# Patient Record
Sex: Female | Born: 1970 | Race: Black or African American | Hispanic: No | Marital: Married | Smoking: Current every day smoker
Health system: Southern US, Community
[De-identification: ages and names within clinical notes are randomized; demographics above are authoritative.]

## PROBLEM LIST (undated history)

## (undated) DIAGNOSIS — K2970 Gastritis, unspecified, without bleeding: Secondary | ICD-10-CM

## (undated) DIAGNOSIS — G43909 Migraine, unspecified, not intractable, without status migrainosus: Secondary | ICD-10-CM

## (undated) DIAGNOSIS — E079 Disorder of thyroid, unspecified: Secondary | ICD-10-CM

## (undated) HISTORY — PX: ABDOMINAL SURGERY: SHX537

## (undated) HISTORY — PX: ABDOMINAL HYSTERECTOMY: SHX81

## (undated) HISTORY — PX: CHOLECYSTECTOMY: SHX55

---

## 1997-11-04 ENCOUNTER — Other Ambulatory Visit: Admission: RE | Admit: 1997-11-04 | Discharge: 1997-11-04 | Payer: Self-pay | Admitting: Obstetrics & Gynecology

## 1998-06-06 ENCOUNTER — Ambulatory Visit (HOSPITAL_COMMUNITY): Admission: RE | Admit: 1998-06-06 | Discharge: 1998-06-06 | Payer: Self-pay | Admitting: Obstetrics & Gynecology

## 1998-06-06 ENCOUNTER — Encounter: Payer: Self-pay | Admitting: Obstetrics & Gynecology

## 1998-07-08 ENCOUNTER — Ambulatory Visit (HOSPITAL_COMMUNITY): Admission: RE | Admit: 1998-07-08 | Discharge: 1998-07-08 | Payer: Self-pay | Admitting: Obstetrics & Gynecology

## 1998-11-29 ENCOUNTER — Other Ambulatory Visit: Admission: RE | Admit: 1998-11-29 | Discharge: 1998-11-29 | Payer: Self-pay | Admitting: Obstetrics & Gynecology

## 2000-02-21 ENCOUNTER — Other Ambulatory Visit: Admission: RE | Admit: 2000-02-21 | Discharge: 2000-02-21 | Payer: Self-pay | Admitting: Obstetrics & Gynecology

## 2001-03-24 ENCOUNTER — Inpatient Hospital Stay (HOSPITAL_COMMUNITY): Admission: AD | Admit: 2001-03-24 | Discharge: 2001-03-27 | Payer: Self-pay | Admitting: Obstetrics & Gynecology

## 2001-03-24 ENCOUNTER — Encounter (INDEPENDENT_AMBULATORY_CARE_PROVIDER_SITE_OTHER): Payer: Self-pay | Admitting: Specialist

## 2001-04-18 ENCOUNTER — Other Ambulatory Visit: Admission: RE | Admit: 2001-04-18 | Discharge: 2001-04-18 | Payer: Self-pay | Admitting: Obstetrics & Gynecology

## 2002-06-01 ENCOUNTER — Other Ambulatory Visit: Admission: RE | Admit: 2002-06-01 | Discharge: 2002-06-01 | Payer: Self-pay | Admitting: Obstetrics & Gynecology

## 2002-12-01 ENCOUNTER — Encounter (INDEPENDENT_AMBULATORY_CARE_PROVIDER_SITE_OTHER): Payer: Self-pay | Admitting: Specialist

## 2002-12-01 ENCOUNTER — Inpatient Hospital Stay (HOSPITAL_COMMUNITY): Admission: RE | Admit: 2002-12-01 | Discharge: 2002-12-03 | Payer: Self-pay | Admitting: Obstetrics & Gynecology

## 2005-03-27 ENCOUNTER — Other Ambulatory Visit: Admission: RE | Admit: 2005-03-27 | Discharge: 2005-03-27 | Payer: Self-pay | Admitting: Obstetrics & Gynecology

## 2005-08-07 ENCOUNTER — Emergency Department (HOSPITAL_COMMUNITY): Admission: EM | Admit: 2005-08-07 | Discharge: 2005-08-07 | Payer: Self-pay | Admitting: Family Medicine

## 2005-12-11 ENCOUNTER — Encounter: Admission: RE | Admit: 2005-12-11 | Discharge: 2005-12-11 | Payer: Self-pay | Admitting: *Deleted

## 2006-01-14 ENCOUNTER — Encounter: Admission: RE | Admit: 2006-01-14 | Discharge: 2006-01-14 | Payer: Self-pay | Admitting: Internal Medicine

## 2006-03-18 ENCOUNTER — Encounter: Admission: RE | Admit: 2006-03-18 | Discharge: 2006-03-18 | Payer: Self-pay | Admitting: Internal Medicine

## 2006-04-19 ENCOUNTER — Emergency Department (HOSPITAL_COMMUNITY): Admission: EM | Admit: 2006-04-19 | Discharge: 2006-04-19 | Payer: Self-pay | Admitting: Emergency Medicine

## 2008-06-02 ENCOUNTER — Emergency Department (HOSPITAL_BASED_OUTPATIENT_CLINIC_OR_DEPARTMENT_OTHER): Admission: EM | Admit: 2008-06-02 | Discharge: 2008-06-02 | Payer: Self-pay | Admitting: Internal Medicine

## 2008-06-02 ENCOUNTER — Ambulatory Visit: Payer: Self-pay | Admitting: Interventional Radiology

## 2010-03-04 ENCOUNTER — Emergency Department (HOSPITAL_BASED_OUTPATIENT_CLINIC_OR_DEPARTMENT_OTHER): Admission: EM | Admit: 2010-03-04 | Discharge: 2010-03-05 | Payer: Self-pay | Admitting: Emergency Medicine

## 2010-07-17 ENCOUNTER — Encounter: Payer: Self-pay | Admitting: Internal Medicine

## 2010-08-15 ENCOUNTER — Emergency Department (HOSPITAL_COMMUNITY)
Admission: EM | Admit: 2010-08-15 | Discharge: 2010-08-15 | Disposition: A | Discharge status: Home / Self Care | Payer: BC Managed Care – PPO | Attending: Emergency Medicine | Admitting: Emergency Medicine

## 2010-08-15 DIAGNOSIS — H53149 Visual discomfort, unspecified: Secondary | ICD-10-CM | POA: Insufficient documentation

## 2010-08-15 DIAGNOSIS — R110 Nausea: Secondary | ICD-10-CM | POA: Insufficient documentation

## 2010-08-15 DIAGNOSIS — G43909 Migraine, unspecified, not intractable, without status migrainosus: Secondary | ICD-10-CM | POA: Insufficient documentation

## 2010-08-15 DIAGNOSIS — J3489 Other specified disorders of nose and nasal sinuses: Secondary | ICD-10-CM | POA: Insufficient documentation

## 2010-11-10 NOTE — H&P (Signed)
NAME:  [name], [name]                          ACCOUNT NO.:  [id number]   MEDICAL RECORD NO.:  [id number]                   PATIENT TYPE:  INP   LOCATION:  NA                                   FACILITY:  [facility]   PHYSICIAN:  Gerrit Friends. Aldona Bar, M.D.                DATE OF BIRTH:  1971-03-21   DATE OF ADMISSION:  12/01/2002  DATE OF DISCHARGE:                                HISTORY & PHYSICAL   HISTORY:  This patient is a 40 year old female admitted for a total  abdominal hysterectomy with a preoperative diagnosis of persistent  dysmenorrhea and menorrhagia.  This patient was first seen by me in June  1998 with lower abdominal discomfort/pelvic pain.  She ultimately underwent  a laparoscopy in July 1998 with findings consistent with gallstones imbedded  in the left ovary and pelvic peritoneum in various locations.  She  ultimately underwent an exploratory laparotomy in December 1998 at which  time these calculi, clinically gallstones, were removed along with a  significant lysis of adhesions.  Apparently she had had her gallbladder  removed laparoscopically at an earlier time and this was felt to be the  etiology of her problem.   She again required a laparoscopy in January 2000.  She had significant  adhesions involving the uterus and the anterior abdominal wall.  She had  significant lysis of adhesions.  She subsequently had a cesarean section in  2002 after spontaneous rupture of membranes and at that time also underwent  a bilateral partial salpingectomy for permanent elective sterilization.  Her  repeat cesarean section was scheduled electively but unfortunately she  underwent ruptured membranes prior to the date of scheduling and underwent  the procedure several days earlier.  Her postoperative course was  uncomplicated.  Unfortunately, again she began having significant  menorrhagia and dysmenorrhea and for a period of time was managed with  Cataflam and oral contraceptives to  control her cycles, but unfortunately  her pressure became elevated necessitating discontinuation of her birth  control pills and treatment of her hypertension with hydrochlorothiazide  which not only resulted in her blood pressure returning to normal but also  resulted in resolution of her headaches that were probably associated with  her hypertension.  She is now scheduled for total abdominal hysterectomy  because of persistent menorrhagia and dysmenorrhea, probably associated with  recurrent adhesions or possibly even adenomyosis.   PAST MEDICAL HISTORY/PREVIOUS PROCEDURES:  1. Gallbladder surgery March 1997.  2. Diagnostic laparoscopy July 1998.  3. Exploratory laparotomy November 1998.  4. Laparoscopy with adhesions January 2000.  5. Repeat cesarean section 2002.   ALLERGIES:  The patient is allergic to PENICILLIN by history.  She is also  sensitive to PERCOCET - causes itching.   CURRENT MEDICATIONS:  Hydrochlorothiazide 25 mg in the morning for control  of her hypertension.   FAMILY HISTORY AND SOCIAL HISTORY:  Noncontributory.   REVIEW  OF SYSTEMS:  Negative with the exception of above.   PHYSICAL EXAMINATION AT THE TIME OF ADMISSION:  GENERAL:  Finds a well-  developed female in no distress.  VITAL SIGNS:  Weight 186, blood pressure 120/70, pulse 80 and regular,  respirations 18 and regular, temperature 98.2  HEENT:  Negative.  Thyroid not enlarged.  CHEST:  Clear.  CARDIOVASCULAR:  Regular rhythm, no murmur.  BREASTS:  Negative.  ABDOMEN:  There is a well-healed Pfannenstiel incision site.  Abdomen is  unremarkable except for some mild deep tenderness in the pelvis.  PELVIC:  External genitalia, BUS normal.  Introitus bidigital.  Vault fairly  well supported.  Uterus upper limits of normal size, relatively immobile.  Adnexal area is negative.  NEUROLOGIC:  Physiologic.  EXTREMITIES:  Negative.   IMPRESSION:  Persistent menorrhagia and dysmenorrhea - chronic pelvic  pain -  possible adenomyosis, possible recurrent adhesions.   PLAN:  Total abdominal hysterectomy.                                               Gerrit Friends. Aldona Bar, M.D.    RMW/MEDQ  D:  11/27/2002  T:  11/27/2002  Job:  [id number]

## 2010-11-10 NOTE — Op Note (Signed)
[name], [name]                          ACCOUNT NO.:  [id number]   MEDICAL RECORD NO.:  [id number]                   PATIENT TYPE:  INP   LOCATION:  9399                                 FACILITY:  [facility]   PHYSICIAN:  Gerrit Friends. Aldona Bar, M.D.                DATE OF BIRTH:  Oct 27, 1970   DATE OF PROCEDURE:  12/01/2002  DATE OF DISCHARGE:                                 OPERATIVE REPORT   PREOPERATIVE DIAGNOSES:  1. Menorrhagia.  2. Dysmenorrhea.  3. Suspected pelvic adhesions.   POSTOPERATIVE DIAGNOSES:  1. Menorrhagia.  2. Dysmenorrhea.  3. Suspected pelvic adhesions.   PROCEDURE:  Total abdominal hysterectomy, lysis of multiple pelvic  adhesions, and removal of hydatid cyst distal portion left fallopian tube.   SURGEON:  Gerrit Friends. Aldona Bar, M.D.   ASSISTANTLuvenia Redden, M.D.   DESCRIPTION OF PROCEDURE:  The patient was taken to the operating room where  after the satisfactory induction of general endotracheal anesthesia, was  prepped and draped in the usual fashion for abdominal surgery having been  placed in the supine position.  A Foley catheter was placed as part of the  prep.   After the patient was adequately draped, the procedure was begun.  Because  the patient had a large keloid from her two previous cesarean sections and  her previous exploratory laparotomy, the keloid was removed.  Further  dissection completed, opening the abdomen essentially through a Pfannenstiel  - dissecting down sharply to the fascia - incising the fascia transversely,  creating subfascial space inferiorly and superiorly identifying and entering  the peritoneum without difficulty.  At this time, the abdomen was inspected.  There were a lot of adhesions encountered between the lower left abdominal  wall and the left anterior portion of the uterus. Theses were sharply lysed.  There were also adhesions involving the remnants of the left fallopian tube  and the descending sigmoid which were  also sharply lysed.  A large hydatid  cyst along with the distal portion of the left fallopian tube were removed  with the aid of a clamp and suture of 0 Vicryl.  At this time, once the  bowel was adequately packed off and retractors were placed, the procedure  was begun.  Long Kelly clamps were placed at the corners of the uterus and  the uterus was elevated.  The left fallopian tube was clamped, opened,  rendered hemostatic and further dissection anteriorly pushed down the  bladder and permitted isolation of the ovarian pedicle, thus allowing the  left ovary to be freed from the uterus.  The left ovary was normal and was  left in situ.  At this time, similar procedure was carried out on the right  round ligament and the right ovary.  Further dissection anteriorly was  carried out, thus pushing the bladder off the lower uterine segment.  The  uterine artery pedicles  were then clamped, cut, and sutured secure with 0  Vicryl suture and additional parametrial pedicles taken in similar fashion.  This was carried down to the level of the vaginal angle.  At this time, the  uterus was removed by making a stab wound below the cervix into the vagina  and using the Satinsky scissors and excising the uterus and cervix.  The  vaginal angles were elevated and two vaginal angles were secured with figure-  of-eight sutures and the cuff was closed with 0 Vicryl in figure-of-eight  fashion.  At this time, profuse irrigation was carried out and hemostasis  was noted to be adequate.  Both ovaries were suspended to the round ligament  stumps, thus freeing them up out of the pelvis.  Again, hemostasis was  sought after and created in the pelvis.  The packs at this time were  removed.  Appendix was visualized and palpated and noted to be normal and  was left in.  Retractor was removed and at this time, closure of the abdomen  was begun in the usual fashion.  The abdominal peritoneum was closed with 0  Vicryl in  running fashion.  Muscles secured with same.  Subfascial space was  rendered hemostatic and fascia was then closed with 0 Vicryl from angle in  the midline bilaterally. Subcutaneous tissue was rendered hemostatic.  Staples were then used to close the skin.  A sterile compressive dressing  was applied.  At this time, the patient was transported to the recovery room  in satisfactory condition having tolerated the procedure well.  The  estimated blood loss was 200 mL.  All counts were correct x2.   SPECIMENS:  Uterus and cervix as well as the hydatid cyst and distal portion  of the left fallopian tube sent as a separate specimen.   At the appropriate points during the procedure, both ureters were palpated  and noted to be normal in their course and again, at the conclusion of the  hysterectomy, the ureters were palpated and noted to be what felt to be  totally intact.   In summary, this patient underwent a total abdominal hysterectomy and lysis  of adhesions for menorrhagia and dysmenorrhea unrelieved by medical  management - complicated by the development of hypertension.  The procedure  went well.  Estimated blood loss was 200 mL.  All counts were correct x2.                                               Gerrit Friends. Aldona Bar, M.D.    RMW/MEDQ  D:  12/01/2002  T:  12/01/2002  Job:  [id number]

## 2010-11-10 NOTE — Discharge Summary (Signed)
[facility]  Patient:    [name], [name] Visit Number: [id number] MRN: [id number]          Service Type: OBS Location: 910A 9108 01 Attending Physician:  Lars Pinks Dictated by:   Devoria Albe Edward Jolly, M.D. Admit Date:  03/24/2001 Discharge Date: 03/27/2001                             Discharge Summary  ADMISSION DIAGNOSES:          1. Intrauterine gestation at 38+6 weeks.                               2. History of prior cesarean section,                                  desire for repeat cesarean section and                                  bilateral tubal ligation.                               3. Spontaneous rupture of membranes.  DISCHARGE DIAGNOSES:          Status post repeat low segment transverse                               cesarean section with bilateral tubal                               ligation.  SIGNIFICANT OPERATIONS        A repeat low segment transverse cesarean section AND PROCEDURES:               with bilateral tubal ligation was performed                               under the direction of Dr. Ilda Mori with                               the assistance of Dr. Marina Gravel at [facility] on March 24, 2001.  PERTINENT HISTORY AND PHYSICAL EXAMINATION:         The patient was a 40 year old, gravida 4, para 1, female with an EDC of April 01, 2001 who was admitted with spontaneous rupture of membranes. The patient had been scheduled for an elective repeat cesarean section with bilateral tubal ligation on March 26, 2001. The patients prenatal course was significant for her history of prior cesarean section and her desire for a repeat cesarean section delivery. Upon admission, spontaneous rupture of membranes was confirmed.  Again, a discussion was held with the patient regarding her options for care, and the patient chose to proceed with a repeat cesarean section and  bilateral tubal  ligation instead of a trial of vaginal delivery.  HOSPITAL COURSE:              The patient was taken to the operating room where a repeat low segment transverse cesarean section and bilateral tubal ligation was performed under the direction of Dr. Ilda Mori and with the assistance of Dr. Marina Gravel. The surgery was without complications, a viable female infant weighing 7 pounds 1 ounce was delivered. The Apgars were 6 at one minute and 9 at five minutes. The vertex was delivered with the assistance of a vacuum. The cord pH was noted to be 7.07.  The patients postoperative course was unremarkable. The patient remained without any fevers. The incision remained clean, dry, and intact. The patients diet was slowly advanced to regular. She was able to ambulate spontaneously and empty her bladder after the Foley catheter was removed. The patients discharge hemoglobin was noted to be 9.4, and she was tolerating this well.  PATHOLOGY REPORT:             The pathology report demonstrated complete transection of the bilateral fallopian tubes with no pathologic abnormalities.  The patient was found to be in good condition and ready for discharge on March 27, 2001. She is discharged to home.  DISCHARGE MEDICATIONS:        1. Darvocet-N 100 one p.o. q.4-6h. p.r.n. pain.                               2. Iron sulfate 325 mg p.o. b.i.d.                               3. Prenatal vitamin one p.o. q.d.  ACTIVITY:                     The patient will have decreased activity at home for six weeks.  DISCHARGE FOLLOWUP:           The patients staples will be removed in the office on March 28, 2001. She will call if she experiences any problems with fever, increased pain, increased bleeding, drainage or erythema around the incision or any other concern. Dictated by:   Devoria Albe Edward Jolly, M.D. Attending Physician:  Lars Pinks DD:  04/29/01 TD:  04/30/01 Job:  [id number]

## 2010-11-10 NOTE — Op Note (Signed)
[facility]  Patient:    [name], [name] Visit Number: [id number] MRN: [id number]          Service Type: OBS Location: 910A 9108 01 Attending Physician:  Lars Pinks Dictated by:   Caralyn Guile Arlyce Dice, M.D. Proc. Date: 03/24/01 Admit Date:  03/24/2001                             Operative Report  PREOPERATIVE DIAGNOSES:       1. Previous cesarean section.                               2. Spontaneous rupture of membranes.                               3. Voluntary sterilization.                               4. The patient requests repeat cesarean section.  POSTOPERATIVE DIAGNOSES:      1. Previous cesarean section.                               2. Spontaneous rupture of membranes.                               3. Voluntary sterilization.                               4. The patient requests repeat cesarean section.  PROCEDURES:                   1. Repeat low transverse cesarean section.                               2. Bilateral partial salpingectomy for                                  sterilization.  SURGEON:                      Richard D. Arlyce Dice, M.D.  ASSISTANT:                    Marina Gravel, M.D.  ANESTHESIA:                   Spinal.  ESTIMATED BLOOD LOSS:         500 cc.  FINDINGS:                     Female infant.  Apgar score 6 and 9.  Weight 7 lb 1 oz.  Arterial cord pH 7.07.  Clear amniotic fluid.  Normal-appearing right tube an ovary.  Left ovary with adhesions to the posterior fundus. Normal-appearing left tube.  INDICATIONS:                  This is a 40 year old gravida 4, para 1 with an estimated date of confinement of April 01, 2001  who presented to triage with spontaneous rupture of membranes.  The patient was scheduled for a repeat cesarean section and bilateral tubal ligation on October 2.  Because of the ruptured membranes, the decision was made to proceed with delivery at this time.  DESCRIPTION OF  PROCEDURE:     The patient was taken to the operating room and a spinal anesthesia was placed.  She was then placed in the left lateral supine position, the abdomen was prepped and draped in sterile fashion and the bladder was catheterized.  A low transverse incision was made through the previous laparotomy scar and carried down to the fascia, which was extended transversely.  The rectus muscle was divided in the midline.  The peritoneum was entered bluntly and extended sharply.  The lower segment was identified, the serosa was incised and the bladder was displaced inferiorly.  A low transverse incision was made in the fundus.  The membranes were ruptured.  The infant was delivered with the aid of a vacuum extractor.  The delivery took several minutes, but was without complication.  The infant was delivered. Apgar scores were 6 and 9.  Arterial cord pH was obtained, and this was 7.07. The placenta was then delivered.  The uterus was bluntly curettaged.  The lower segment was closed with a running interlocking #1 Vicryl suture.  The bladder flap was not closed.  Attention was turned to the fallopian tubes. The left tube was grasped with a Babcock clamp and traced to the fimbriated end.  A knuckle of tube at the isthmic-ampullary junction was then doubly ligated and a portion of the tube was excised.  An identical procedure was then carried out on the contralateral tube.  The peritoneum and rectus muscle were then reopposed in the midline.  The fascia was closed with running 0 Vicryl suture. The subcutaneous tissue was closed with 3-0 Vicryl suture.  The skin was closed with staples.  The patient tolerated the procedure well and left the operating room in good condition. Dictated by:   Caralyn Guile Arlyce Dice, M.D. Attending Physician:  Lars Pinks DD:  03/24/01 TD:  03/24/01 Job: [id number]

## 2010-11-10 NOTE — Discharge Summary (Signed)
   [name], [name]                          ACCOUNT NO.:  [id number]   MEDICAL RECORD NO.:  [id number]                   PATIENT TYPE:  INP   LOCATION:  9327                                 FACILITY:  [facility]   PHYSICIAN:  Gerrit Friends. Aldona Bar, M.D.                DATE OF BIRTH:  19-Jul-1970   DATE OF ADMISSION:  12/01/2002  DATE OF DISCHARGE:  12/03/2002                                 DISCHARGE SUMMARY   j   DISCHARGE DIAGNOSES:  1. Menorrhagia and dysmenorrhea.  2. Fibroid uterus.  3. Paratubal cyst, left.   PROCEDURES:  Total abdominal hysterectomy, lysis of adhesions, removal of  left paratubal cyst.   HOSPITAL COURSE SUMMARY:  This 40 year old female was admitted after a  history of menorrhagia and dysmenorrhea which initially was controlled by  medical management but unfortunately because of elevation of her blood  pressure associated with the onset of headaches, medical management had to  be stopped.   She was taken to the operating room on the day of admission after a normal  preoperative workup at which time she underwent exploratory laparotomy  through a previous incision site.  She underwent lysis of adhesions and  removal of a left paratubal cyst as well as a total abdominal hysterectomy.  Pathologic specimen was totally benign.  Discharge hemoglobin 10.4 with a  white count of 10,400 and a normal platelet count.   On the morning of December 03, 2002, she was ambulating well, tolerating a  regular diet well, having normal bowel and bladder function was afebrile.  Her wound was clean and dry and she was deemed ready for discharge.  Accordingly, she was given all appropriate instructions and understood all  instructions well.  Her staples were to be removed in the office in four  days.  This is because he now has had four procedures through the same  incision.   DISCHARGE MEDICATIONS:  1. Motrin 600 mg every 6 hours as needed for pain.  2. Dilaudid 2 mg every 4 to 6 hours  as needed for severe pain.  3. She will continue her hydrochlorothiazide 25 mg in the morning and she     requested a prescription for Zyban for smoking cessation which was given     to her.  She was appropriately instructed on Zyban.   FOLLOW UP:  Followup in the office will be carried out on June 14th for  staple removal and afterwards as routine.   CONDITION ON DISCHARGE:  Improved.                                               Gerrit Friends. Aldona Bar, M.D.    RMW/MEDQ  D:  12/03/2002  T:  12/03/2002  Job:  [id number]

## 2011-05-31 ENCOUNTER — Encounter: Payer: Self-pay | Admitting: Family Medicine

## 2011-05-31 ENCOUNTER — Emergency Department (HOSPITAL_BASED_OUTPATIENT_CLINIC_OR_DEPARTMENT_OTHER)
Admission: EM | Admit: 2011-05-31 | Discharge: 2011-05-31 | Disposition: A | Discharge status: Home / Self Care | Payer: BC Managed Care – PPO | Attending: Emergency Medicine | Admitting: Emergency Medicine

## 2011-05-31 DIAGNOSIS — F17200 Nicotine dependence, unspecified, uncomplicated: Secondary | ICD-10-CM | POA: Insufficient documentation

## 2011-05-31 DIAGNOSIS — S39012A Strain of muscle, fascia and tendon of lower back, initial encounter: Secondary | ICD-10-CM

## 2011-05-31 DIAGNOSIS — M549 Dorsalgia, unspecified: Secondary | ICD-10-CM | POA: Insufficient documentation

## 2011-05-31 HISTORY — DX: Gastritis, unspecified, without bleeding: K29.70

## 2011-05-31 HISTORY — DX: Migraine, unspecified, not intractable, without status migrainosus: G43.909

## 2011-05-31 MED ORDER — TRAMADOL HCL 50 MG PO TABS: 50.0000 mg | ORAL_TABLET | Freq: Four times a day (QID) | ORAL | Status: AC | PRN

## 2011-05-31 MED ORDER — CYCLOBENZAPRINE HCL 10 MG PO TABS: 10.0000 mg | ORAL_TABLET | Freq: Two times a day (BID) | ORAL | Status: AC | PRN

## 2011-05-31 NOTE — ED Notes (Signed)
Pt c/o right low back pain "spasm-like" since lifting boxes Monday night. Pt denies n/v/d, denies urinary sx.

## 2011-05-31 NOTE — ED Provider Notes (Signed)
History     CSN: [id number] Arrival date & time: 05/31/2011  9:35 AM   First MD Initiated Contact with Patient 05/31/11 1017      Chief Complaint  Patient presents with  . Back Pain    (Consider location/radiation/quality/duration/timing/severity/associated sxs/prior treatment) HPI 40 yo F presents with 3 days of right-sided thoracic and lumbar paraspinal muscle tenderness since injuring herself while moving christmas decorations at home.  Pain is worse with movement and better with rest.  She says it an ache and rates it a 5/10 at rest.  She was sent by her supervisor who thought she might have a pinched nerve.  She denies urinary symptoms, incontinence, neurologic deficits of any sort, and any predisposition to fractures.There are no other modifying or associated factors. Past Medical History  Diagnosis Date  . Migraines   . Gastritis     Past Surgical History  Procedure Date  . Abdominal hysterectomy   . Cholecystectomy   . Abdominal surgery     History reviewed. No pertinent family history.  History  Substance Use Topics  . Smoking status: Current Everyday Smoker  . Smokeless tobacco: Not on file  . Alcohol Use: Yes    OB History    Grav Para Term Preterm Abortions TAB SAB Ect Mult Living                  Review of Systems  Constitutional: Negative.   HENT: Negative.   Eyes: Negative.   Respiratory: Negative.   Cardiovascular: Negative.   Gastrointestinal: Negative.   Genitourinary: Negative.   Musculoskeletal: Positive for back pain.  Skin: Negative.   Neurological: Negative.   Hematological: Negative.   Psychiatric/Behavioral: Negative.   All other systems reviewed and are negative.    Allergies  Codeine; Penicillins; and Percocet  Home Medications   Current Outpatient Rx  Name Route Sig Dispense Refill  . NEXIUM PO Oral Take by mouth.      . TOPIRAMATE PO Oral Take by mouth.      . CYCLOBENZAPRINE HCL 10 MG PO TABS Oral Take 1 tablet (10 mg  total) by mouth 2 (two) times daily as needed for muscle spasms. 20 tablet 0  . TRAMADOL HCL 50 MG PO TABS Oral Take 1 tablet (50 mg total) by mouth every 6 (six) hours as needed for pain. Maximum dose= 8 tablets per day 15 tablet 0    BP 121/82  Pulse 67  Temp(Src) 97.1 F (36.2 C) (Oral)  Resp 16  Ht 5\' 6"  (1.676 m)  Wt 187 lb (84.823 kg)  BMI 30.18 kg/m2  SpO2 100%  Physical Exam  Nursing note and vitals reviewed. Constitutional: She is oriented to person, place, and time. She appears well-developed and well-nourished. No distress.  HENT:  Head: Normocephalic and atraumatic.  Eyes: Conjunctivae and EOM are normal. Pupils are equal, round, and reactive to light.  Neck: Normal range of motion.  Cardiovascular: Normal rate, regular rhythm, normal heart sounds and intact distal pulses.  Exam reveals no gallop and no friction rub.   No murmur heard. Pulmonary/Chest: Effort normal and breath sounds normal. No respiratory distress. She has no wheezes. She has no rales.  Abdominal: Soft. Bowel sounds are normal. She exhibits no distension. There is no tenderness. There is no rebound and no guarding.  Musculoskeletal: Normal range of motion. She exhibits no edema.       TTP over the right thoracic and lumbar musculature  Neurological: She is alert and oriented to  person, place, and time. No cranial nerve deficit. She exhibits normal muscle tone. Coordination normal.  Skin: Skin is warm and dry. No rash noted. No erythema.  Psychiatric: She has a normal mood and affect.    ED Course  Procedures (including critical care time)  Labs Reviewed - No data to display No results found.   1. Back strain       MDM  Patient had back strain with no red flag symptoms requiring further evaluation. She was discharged with flexeril and tramadol for her symptoms.  She was discharged in good condition.        Cyndra Numbers, MD 05/31/11 2049

## 2012-08-27 ENCOUNTER — Other Ambulatory Visit: Payer: Self-pay | Admitting: Internal Medicine

## 2012-08-27 DIAGNOSIS — E049 Nontoxic goiter, unspecified: Secondary | ICD-10-CM

## 2012-09-10 ENCOUNTER — Encounter (HOSPITAL_COMMUNITY)
Admission: RE | Admit: 2012-09-10 | Discharge: 2012-09-10 | Disposition: A | Discharge status: Home / Self Care | Payer: BC Managed Care – PPO | Source: Ambulatory Visit | Attending: Internal Medicine | Admitting: Internal Medicine

## 2012-09-10 DIAGNOSIS — E049 Nontoxic goiter, unspecified: Secondary | ICD-10-CM | POA: Insufficient documentation

## 2012-09-11 ENCOUNTER — Encounter (HOSPITAL_COMMUNITY)
Admission: RE | Admit: 2012-09-11 | Discharge: 2012-09-11 | Disposition: A | Discharge status: Home / Self Care | Payer: BC Managed Care – PPO | Source: Ambulatory Visit | Attending: Internal Medicine | Admitting: Internal Medicine

## 2012-09-11 MED ORDER — SODIUM PERTECHNETATE TC 99M INJECTION
10.8000 | Freq: Once | INTRAVENOUS | Status: AC | PRN
  Administered 2012-09-11: 10.8 via INTRAVENOUS

## 2012-09-11 MED ORDER — SODIUM IODIDE I 131 CAPSULE
14.0000 | Freq: Once | INTRAVENOUS | Status: AC | PRN
  Administered 2012-09-11: 14 via ORAL

## 2012-10-01 ENCOUNTER — Other Ambulatory Visit (HOSPITAL_COMMUNITY): Payer: Self-pay | Admitting: Internal Medicine

## 2012-10-01 DIAGNOSIS — Z1231 Encounter for screening mammogram for malignant neoplasm of breast: Secondary | ICD-10-CM

## 2012-10-02 ENCOUNTER — Ambulatory Visit (HOSPITAL_COMMUNITY): Payer: BC Managed Care – PPO

## 2012-10-02 ENCOUNTER — Ambulatory Visit (HOSPITAL_COMMUNITY)
Admission: RE | Admit: 2012-10-02 | Discharge: 2012-10-02 | Disposition: A | Discharge status: Home / Self Care | Payer: BC Managed Care – PPO | Source: Ambulatory Visit | Attending: Internal Medicine | Admitting: Internal Medicine

## 2012-10-02 DIAGNOSIS — Z1231 Encounter for screening mammogram for malignant neoplasm of breast: Secondary | ICD-10-CM

## 2013-10-28 ENCOUNTER — Emergency Department (HOSPITAL_BASED_OUTPATIENT_CLINIC_OR_DEPARTMENT_OTHER): Payer: BC Managed Care – PPO

## 2013-10-28 ENCOUNTER — Encounter (HOSPITAL_BASED_OUTPATIENT_CLINIC_OR_DEPARTMENT_OTHER): Payer: Self-pay | Admitting: Emergency Medicine

## 2013-10-28 ENCOUNTER — Emergency Department (HOSPITAL_BASED_OUTPATIENT_CLINIC_OR_DEPARTMENT_OTHER)
Admission: EM | Admit: 2013-10-28 | Discharge: 2013-10-28 | Disposition: A | Discharge status: Home / Self Care | Payer: BC Managed Care – PPO | Attending: Emergency Medicine | Admitting: Emergency Medicine

## 2013-10-28 DIAGNOSIS — Z880 Allergy status to penicillin: Secondary | ICD-10-CM | POA: Insufficient documentation

## 2013-10-28 DIAGNOSIS — Z79899 Other long term (current) drug therapy: Secondary | ICD-10-CM | POA: Insufficient documentation

## 2013-10-28 DIAGNOSIS — K5900 Constipation, unspecified: Secondary | ICD-10-CM | POA: Insufficient documentation

## 2013-10-28 DIAGNOSIS — F17200 Nicotine dependence, unspecified, uncomplicated: Secondary | ICD-10-CM | POA: Insufficient documentation

## 2013-10-28 DIAGNOSIS — E079 Disorder of thyroid, unspecified: Secondary | ICD-10-CM | POA: Insufficient documentation

## 2013-10-28 DIAGNOSIS — Z9089 Acquired absence of other organs: Secondary | ICD-10-CM | POA: Insufficient documentation

## 2013-10-28 DIAGNOSIS — Z8679 Personal history of other diseases of the circulatory system: Secondary | ICD-10-CM | POA: Insufficient documentation

## 2013-10-28 DIAGNOSIS — Z90710 Acquired absence of both cervix and uterus: Secondary | ICD-10-CM | POA: Insufficient documentation

## 2013-10-28 HISTORY — DX: Disorder of thyroid, unspecified: E07.9

## 2013-10-28 LAB — URINALYSIS, ROUTINE W REFLEX MICROSCOPIC
BILIRUBIN URINE: NEGATIVE
Glucose, UA: NEGATIVE mg/dL
Hgb urine dipstick: NEGATIVE
Ketones, ur: NEGATIVE mg/dL
Leukocytes, UA: NEGATIVE
NITRITE: NEGATIVE
Protein, ur: NEGATIVE mg/dL
SPECIFIC GRAVITY, URINE: 1.026 (ref 1.005–1.030)
UROBILINOGEN UA: 1 mg/dL (ref 0.0–1.0)
pH: 6.5 (ref 5.0–8.0)

## 2013-10-28 LAB — CBC WITH DIFFERENTIAL/PLATELET
BASOS PCT: 0 % (ref 0–1)
Basophils Absolute: 0 10*3/uL (ref 0.0–0.1)
EOS ABS: 0.1 10*3/uL (ref 0.0–0.7)
EOS PCT: 2 % (ref 0–5)
HCT: 37.1 % (ref 36.0–46.0)
HEMOGLOBIN: 12.5 g/dL (ref 12.0–15.0)
Lymphocytes Relative: 56 % — ABNORMAL HIGH (ref 12–46)
Lymphs Abs: 5 10*3/uL — ABNORMAL HIGH (ref 0.7–4.0)
MCH: 30.3 pg (ref 26.0–34.0)
MCHC: 33.7 g/dL (ref 30.0–36.0)
MCV: 89.8 fL (ref 78.0–100.0)
MONO ABS: 0.6 10*3/uL (ref 0.1–1.0)
MONOS PCT: 6 % (ref 3–12)
Neutro Abs: 3.1 10*3/uL (ref 1.7–7.7)
Neutrophils Relative %: 35 % — ABNORMAL LOW (ref 43–77)
PLATELETS: 305 10*3/uL (ref 150–400)
RBC: 4.13 MIL/uL (ref 3.87–5.11)
RDW: 13.7 % (ref 11.5–15.5)
WBC: 8.8 10*3/uL (ref 4.0–10.5)

## 2013-10-28 LAB — COMPREHENSIVE METABOLIC PANEL
ALBUMIN: 3.6 g/dL (ref 3.5–5.2)
ALT: 12 U/L (ref 0–35)
AST: 14 U/L (ref 0–37)
Alkaline Phosphatase: 115 U/L (ref 39–117)
BUN: 12 mg/dL (ref 6–23)
CALCIUM: 9.1 mg/dL (ref 8.4–10.5)
CHLORIDE: 110 meq/L (ref 96–112)
CO2: 21 mEq/L (ref 19–32)
Creatinine, Ser: 1.1 mg/dL (ref 0.50–1.10)
GFR, EST AFRICAN AMERICAN: 70 mL/min — AB (ref >90)
GFR, EST NON AFRICAN AMERICAN: 60 mL/min — AB (ref >90)
GLUCOSE: 101 mg/dL — AB (ref 70–99)
Potassium: 3.7 mEq/L (ref 3.7–5.3)
SODIUM: 145 meq/L (ref 137–147)
TOTAL PROTEIN: 6.9 g/dL (ref 6.0–8.3)

## 2013-10-28 LAB — LIPASE, BLOOD: LIPASE: 57 U/L (ref 11–59)

## 2013-10-28 MED ORDER — SODIUM CHLORIDE 0.9 % IV SOLN
1000.0000 mL | INTRAVENOUS | Status: DC
  Administered 2013-10-28: 1000 mL via INTRAVENOUS

## 2013-10-28 MED ORDER — POLYETHYLENE GLYCOL 3350 17 G PO PACK: 17.0000 g | PACK | Freq: Every day | ORAL | Status: AC

## 2013-10-28 MED ORDER — DOCUSATE SODIUM 100 MG PO CAPS: 100.0000 mg | ORAL_CAPSULE | Freq: Two times a day (BID) | ORAL | Status: AC

## 2013-10-28 MED ORDER — SODIUM CHLORIDE 0.9 % IV SOLN
1000.0000 mL | Freq: Once | INTRAVENOUS | Status: AC
  Administered 2013-10-28: 1000 mL via INTRAVENOUS

## 2013-10-28 MED ORDER — IBUPROFEN 800 MG PO TABS: 400.0000 mg | ORAL_TABLET | Freq: Three times a day (TID) | ORAL | Status: AC

## 2013-10-28 MED ORDER — KETOROLAC TROMETHAMINE 30 MG/ML IJ SOLN
INTRAMUSCULAR | Status: AC
  Administered 2013-10-28: 30 mg
  Filled 2013-10-28: qty 1

## 2013-10-28 NOTE — Discharge Instructions (Signed)
Constipation, Adult  Constipation is when a person:  · Poops (bowel movement) less than 3 times a week.  · Has a hard time pooping.  · Has poop that is dry, hard, or bigger than normal.  HOME CARE   · Eat more fiber, such as fruits, vegetables, whole grains like brown rice, and beans.  · Eat less fatty foods and sugar. This includes French fries, hamburgers, cookies, candy, and soda.  · If you are not getting enough fiber from food, take products with added fiber in them (supplements).  · Drink enough fluid to keep your pee (urine) clear or pale yellow.  · Go to the restroom when you feel like you need to poop. Do not hold it.  · Only take medicine as told by your doctor. Do not take medicines that help you poop (laxatives) without talking to your doctor first.  · Exercise on a regular basis, or as told by your doctor.  GET HELP RIGHT AWAY IF:   · You have bright red blood in your poop (stool).  · Your constipation lasts more than 4 days or gets worse.  · You have belly (abdomen) or butt (rectal) pain.  · You have thin poop (as thin as a pencil).  · You lose weight, and it cannot be explained.  MAKE SURE YOU:   · Understand these instructions.  · Will watch your condition.  · Will get help right away if you are not doing well or get worse.  Document Released: 11/28/2007 Document Revised: 09/03/2011 Document Reviewed: 03/23/2013  [employer]® Patient Information ©2014 [employer].

## 2013-10-28 NOTE — ED Notes (Signed)
Pt reports has had constipation and indigestion, pain in LUQ with radiation to flank.  Worse after eating.  Has had nausea no vomiting.

## 2013-10-28 NOTE — ED Provider Notes (Signed)
CSN: [id number]     Arrival date & time 10/28/13  1906 History  This chart was scribed for Celene KrasJon R Peace Noyes, MD by Beverly MilchJ Harrison Collins, ED Scribe. This patient was seen in room MH11/MH11 and the patient's care was started at 7:26 PM.    Chief Complaint  Patient presents with  . Back Pain    The history is provided by the patient. No language interpreter was used.    HPI Comments: [name] is a 43 y.o. female who presents to the Emergency Department complaining of left flank pain radiating into her left back that began 6 days ago. Pt states she saw a provider at the [facility] in [place] who told her he thought it was a pinched nerve. She reports pain with bending over and movement. She states her pain worsens after eating, and she notices a "pulsating" after eating. She reports two episodes of diarrhea 3 days ago. Pt denies vomiting and nausea. She states she has taken ibuprofen with mild improvement of her pain. Pt declines "strong" pain medication.   Past Medical History  Diagnosis Date  . Migraines   . Gastritis   . Thyroid disease     Past Surgical History  Procedure Laterality Date  . Abdominal hysterectomy    . Cholecystectomy    . Abdominal surgery      No family history on file. History  Substance Use Topics  . Smoking status: Current Every Day Smoker  . Smokeless tobacco: Not on file  . Alcohol Use: Yes     Comment: occ    Review of Systems  Genitourinary: Positive for flank pain.  Musculoskeletal: Positive for back pain.  A complete 10 system review of systems was obtained and all systems are negative except as noted in the HPI and PMH.    Allergies  Codeine; Penicillins; and Percocet   Home Medications    Prior to Admission medications   Medication Sig Start Date End Date Taking? Authorizing Provider  baclofen (LIORESAL) 10 MG tablet Take 10 mg by mouth 3 (three) times daily.   Yes Historical Provider, MD  docusate calcium (SURFAK) 240 MG capsule Take  240 mg by mouth daily.   Yes Historical Provider, MD  hydrOXYzine (ATARAX/VISTARIL) 10 MG tablet Take 10 mg by mouth 3 (three) times daily as needed.   Yes Historical Provider, MD  ibuprofen (ADVIL,MOTRIN) 400 MG tablet Take 400 mg by mouth every 6 (six) hours as needed.   Yes Historical Provider, MD  methimazole (TAPAZOLE) 10 MG tablet Take 10 mg by mouth 3 (three) times daily.   Yes Historical Provider, MD  omeprazole (PRILOSEC) 20 MG capsule Take 20 mg by mouth daily.   Yes Historical Provider, MD  Esomeprazole Magnesium (NEXIUM PO) Take by mouth.      Historical Provider, MD  TOPIRAMATE PO Take 25 mg by mouth.     Historical Provider, MD    Triage Vitals: BP 145/96  Pulse 63  Temp(Src) 99 F (37.2 C) (Oral)  Resp 16  Ht 5\' 6"  (1.676 m)  Wt 191 lb (86.637 kg)  BMI 30.84 kg/m2  SpO2 100%   Physical Exam  Nursing note and vitals reviewed. Constitutional: She appears well-developed and well-nourished. No distress.  HENT:  Head: Normocephalic and atraumatic.  Right Ear: External ear normal.  Left Ear: External ear normal.  Eyes: Conjunctivae are normal. Right eye exhibits no discharge. Left eye exhibits no discharge. No scleral icterus.  Neck: Neck supple. No tracheal deviation  present.  Cardiovascular: Normal rate, regular rhythm and intact distal pulses.   Pulmonary/Chest: Effort normal and breath sounds normal. No stridor. No respiratory distress. She has no wheezes. She has no rales.  Abdominal: Soft. Bowel sounds are normal. She exhibits no distension. There is no tenderness. There is no rebound and no guarding.  Musculoskeletal: She exhibits no edema.       Lumbar back: She exhibits decreased range of motion and tenderness. She exhibits no swelling, no edema and no deformity.  Left paraspinal lumbar region   Neurological: She is alert. She has normal strength. No cranial nerve deficit (no facial droop, extraocular movements intact, no slurred speech) or sensory deficit. She  exhibits normal muscle tone. She displays no seizure activity. Coordination normal.  Skin: Skin is warm and dry. No rash noted.  Psychiatric: She has a normal mood and affect.     ED Course  Procedures (including critical care time)   DIAGNOSTIC STUDIES: Oxygen Saturation is 100% on RA, normal by my interpretation.     COORDINATION OF CARE: 7:31 PM- Pt advised of plan for treatment and pt agrees.   Labs Review Labs Reviewed  CBC WITH DIFFERENTIAL - Abnormal; Notable for the following:    Neutrophils Relative % 35 (*)    Lymphocytes Relative 56 (*)    Lymphs Abs 5.0 (*)    All other components within normal limits  COMPREHENSIVE METABOLIC PANEL - Abnormal; Notable for the following:    Glucose, Bld 101 (*)    Total Bilirubin <0.2 (*)    GFR calc non Af Amer 60 (*)    GFR calc Af Amer 70 (*)    All other components within normal limits  LIPASE, BLOOD  URINALYSIS, ROUTINE W REFLEX MICROSCOPIC    Imaging Review Dg Abd Acute W/chest  10/28/2013   CLINICAL DATA:  Left lower quadrant pain, constipation  EXAM: ACUTE ABDOMEN SERIES (ABDOMEN 2 VIEW & CHEST 1 VIEW)  COMPARISON:  None.  FINDINGS: The heart size and vascular pattern are normal. There is mild left lower lobe atelectatic change and there is mild scarring or atelectatic change in the right lower lobe.  There is no free air. Right upper quadrant clips indicate prior cholecystectomy. Large amount of stool retained throughout the entire colon. There are no abnormally dilated loops of bowel or abnormal air-fluid levels.  IMPRESSION: Significant fecal retention consistent with constipation.   Electronically Signed   By: Esperanza Heiraymond  Rubner M.D.   On: 10/28/2013 20:42      MDM   Final diagnoses:  Constipation   Constipation noted on xray.  Could be associated with her flank pain.   No sign of obstruction.  Labs normal.  Will dc home with stool softeners.   I personally performed the services described in this documentation,  which was scribed in my presence.  The recorded information has been reviewed and is accurate.   Celene KrasJon R Linde Wilensky, MD 10/28/13 2103

## 2014-02-28 ENCOUNTER — Encounter (HOSPITAL_BASED_OUTPATIENT_CLINIC_OR_DEPARTMENT_OTHER): Payer: Self-pay | Admitting: Emergency Medicine

## 2014-02-28 ENCOUNTER — Emergency Department (HOSPITAL_BASED_OUTPATIENT_CLINIC_OR_DEPARTMENT_OTHER)
Admission: EM | Admit: 2014-02-28 | Discharge: 2014-02-28 | Disposition: A | Discharge status: Home / Self Care | Payer: BC Managed Care – PPO | Attending: Emergency Medicine | Admitting: Emergency Medicine

## 2014-02-28 ENCOUNTER — Emergency Department (HOSPITAL_BASED_OUTPATIENT_CLINIC_OR_DEPARTMENT_OTHER): Payer: BC Managed Care – PPO

## 2014-02-28 DIAGNOSIS — Z8639 Personal history of other endocrine, nutritional and metabolic disease: Secondary | ICD-10-CM | POA: Insufficient documentation

## 2014-02-28 DIAGNOSIS — Y9289 Other specified places as the place of occurrence of the external cause: Secondary | ICD-10-CM | POA: Diagnosis not present

## 2014-02-28 DIAGNOSIS — S99929A Unspecified injury of unspecified foot, initial encounter: Secondary | ICD-10-CM | POA: Diagnosis present

## 2014-02-28 DIAGNOSIS — G43909 Migraine, unspecified, not intractable, without status migrainosus: Secondary | ICD-10-CM | POA: Insufficient documentation

## 2014-02-28 DIAGNOSIS — F17200 Nicotine dependence, unspecified, uncomplicated: Secondary | ICD-10-CM | POA: Diagnosis not present

## 2014-02-28 DIAGNOSIS — Z880 Allergy status to penicillin: Secondary | ICD-10-CM | POA: Insufficient documentation

## 2014-02-28 DIAGNOSIS — Y939 Activity, unspecified: Secondary | ICD-10-CM | POA: Diagnosis not present

## 2014-02-28 DIAGNOSIS — S93409A Sprain of unspecified ligament of unspecified ankle, initial encounter: Secondary | ICD-10-CM | POA: Diagnosis not present

## 2014-02-28 DIAGNOSIS — S8990XA Unspecified injury of unspecified lower leg, initial encounter: Secondary | ICD-10-CM | POA: Diagnosis present

## 2014-02-28 DIAGNOSIS — M129 Arthropathy, unspecified: Secondary | ICD-10-CM | POA: Diagnosis not present

## 2014-02-28 DIAGNOSIS — W010XXA Fall on same level from slipping, tripping and stumbling without subsequent striking against object, initial encounter: Secondary | ICD-10-CM | POA: Insufficient documentation

## 2014-02-28 DIAGNOSIS — Z791 Long term (current) use of non-steroidal anti-inflammatories (NSAID): Secondary | ICD-10-CM | POA: Diagnosis not present

## 2014-02-28 DIAGNOSIS — Z862 Personal history of diseases of the blood and blood-forming organs and certain disorders involving the immune mechanism: Secondary | ICD-10-CM | POA: Insufficient documentation

## 2014-02-28 DIAGNOSIS — S99919A Unspecified injury of unspecified ankle, initial encounter: Secondary | ICD-10-CM

## 2014-02-28 DIAGNOSIS — Z79899 Other long term (current) drug therapy: Secondary | ICD-10-CM | POA: Diagnosis not present

## 2014-02-28 DIAGNOSIS — S93401A Sprain of unspecified ligament of right ankle, initial encounter: Secondary | ICD-10-CM

## 2014-02-28 MED ORDER — METHOCARBAMOL 500 MG PO TABS: 1000.0000 mg | ORAL_TABLET | Freq: Four times a day (QID) | ORAL | Status: AC | PRN

## 2014-02-28 MED ORDER — METHOCARBAMOL 500 MG PO TABS
1000.0000 mg | ORAL_TABLET | Freq: Once | ORAL | Status: AC
  Administered 2014-02-28: 1000 mg via ORAL
  Filled 2014-02-28: qty 2

## 2014-02-28 MED ORDER — IBUPROFEN 800 MG PO TABS
800.0000 mg | ORAL_TABLET | Freq: Once | ORAL | Status: AC
  Administered 2014-02-28: 800 mg via ORAL
  Filled 2014-02-28: qty 1

## 2014-02-28 NOTE — Discharge Instructions (Signed)
Rest, Ice intermittently (in the first 24-48 hours), Gentle compression with an Ace wrap, and elevate (Limb above the level of the heart)   Take up to  of ibuprofen (that is usually 4 over the counter pills)  3 times a day for 5 days. Take with food.  For breakthrough pain you may take Robaxin. Do not drink alcohol, drive or operate heavy machinery when taking Robaxin.   Ankle Sprain An ankle sprain is an injury to the strong, fibrous tissues (ligaments) that hold the bones of your ankle joint together.  CAUSES An ankle sprain is usually caused by a fall or by twisting your ankle. Ankle sprains most commonly occur when you step on the outer edge of your foot, and your ankle turns inward. People who participate in sports are more prone to these types of injuries.  SYMPTOMS   Pain in your ankle. The pain may be present at rest or only when you are trying to stand or walk.  Swelling.  Bruising. Bruising may develop immediately or within 1 to 2 days after your injury.  Difficulty standing or walking, particularly when turning corners or changing directions. DIAGNOSIS  Your caregiver will ask you details about your injury and perform a physical exam of your ankle to determine if you have an ankle sprain. During the physical exam, your caregiver will press on and apply pressure to specific areas of your foot and ankle. Your caregiver will try to move your ankle in certain ways. An X-ray exam may be done to be sure a bone was not broken or a ligament did not separate from one of the bones in your ankle (avulsion fracture).  TREATMENT  Certain types of braces can help stabilize your ankle. Your caregiver can make a recommendation for this. Your caregiver may recommend the use of medicine for pain. If your sprain is severe, your caregiver may refer you to a surgeon who helps to restore function to parts of your skeletal system (orthopedist) or a physical therapist. HOME CARE INSTRUCTIONS    Apply ice to your injury for 1-2 days or as directed by your caregiver. Applying ice helps to reduce inflammation and pain.  Put ice in a plastic bag.  Place a towel between your skin and the bag.  Leave the ice on for 15-20 minutes at a time, every 2 hours while you are awake.  Only take over-the-counter or prescription medicines for pain, discomfort, or fever as directed by your caregiver.  Elevate your injured ankle above the level of your heart as much as possible for 2-3 days.  If your caregiver recommends crutches, use them as instructed. Gradually put weight on the affected ankle. Continue to use crutches or a cane until you can walk without feeling pain in your ankle.  If you have a plaster splint, wear the splint as directed by your caregiver. Do not rest it on anything harder than a pillow for the first 24 hours. Do not put weight on it. Do not get it wet. You may take it off to take a shower or bath.  You may have been given an elastic bandage to wear around your ankle to provide support. If the elastic bandage is too tight (you have numbness or tingling in your foot or your foot becomes cold and blue), adjust the bandage to make it comfortable.  If you have an air splint, you may blow more air into it or let air out to make it more comfortable. You may  take your splint off at night and before taking a shower or bath. Wiggle your toes in the splint several times per day to decrease swelling. SEEK MEDICAL CARE IF:   You have rapidly increasing bruising or swelling.  Your toes feel extremely cold or you lose feeling in your foot.  Your pain is not relieved with medicine. SEEK IMMEDIATE MEDICAL CARE IF:  Your toes are numb or blue.  You have severe pain that is increasing. MAKE SURE YOU:   Understand these instructions.  Will watch your condition.  Will get help right away if you are not doing well or get worse. Document Released: 06/11/2005 Document Revised:  03/05/2012 Document Reviewed: 06/23/2011 [facility] Patient Information 2015 [place], [place]. This information is not intended to replace advice given to you by your health care provider. Make sure you discuss any questions you have with your health care provider.

## 2014-02-28 NOTE — ED Provider Notes (Signed)
Medical screening examination/treatment/procedure(s) were performed by non-physician practitioner and as supervising physician I was immediately available for consultation/collaboration.  Toy Cookey, MD 02/28/14 [phone]

## 2014-02-28 NOTE — ED Notes (Signed)
Pt reports fall in parknig lot. Heard a pop in left foot.

## 2014-02-28 NOTE — ED Provider Notes (Signed)
CSN: [id number]     Arrival date & time 02/28/14  1312 History   First MD Initiated Contact with Patient 02/28/14 1519     Chief Complaint  Patient presents with  . Foot Pain     (Consider location/radiation/quality/duration/timing/severity/associated sxs/prior Treatment) HPI   [name] is a 43 y.o. female complaining of pain to right foot after slip and fall in a parking lot last night. Patient states that she tripped and heard a pop in the foot. She was nonambulatory yesterday but the pain has improved since then she is able to walk but with pain. She's been taking Motrin at home with good relief. Patient denies head trauma, cervicalgia, loss of consciousness, chest pain, palpitations or any syncope associated with a fall.  Past Medical History  Diagnosis Date  . Migraines   . Gastritis   . Thyroid disease    Past Surgical History  Procedure Laterality Date  . Abdominal hysterectomy    . Cholecystectomy    . Abdominal surgery     No family history on file. History  Substance Use Topics  . Smoking status: Current Every Day Smoker  . Smokeless tobacco: Not on file  . Alcohol Use: Yes     Comment: occ   OB History   Grav Para Term Preterm Abortions TAB SAB Ect Mult Living                 Review of Systems  10 systems reviewed and found to be negative, except as noted in the HPI.   Allergies  Codeine; Penicillins; and Percocet  Home Medications   Prior to Admission medications   Medication Sig Start Date End Date Taking? Authorizing Provider  baclofen (LIORESAL) 10 MG tablet Take 10 mg by mouth 3 (three) times daily.    Historical Provider, MD  docusate calcium (SURFAK) 240 MG capsule Take 240 mg by mouth daily.    Historical Provider, MD  docusate sodium (COLACE) 100 MG capsule Take 1 capsule (100 mg total) by mouth every 12 (twelve) hours. 10/28/13   Linwood Dibbles, MD  Esomeprazole Magnesium (NEXIUM PO) Take by mouth.      Historical Provider, MD  hydrOXYzine  (ATARAX/VISTARIL) 10 MG tablet Take 10 mg by mouth 3 (three) times daily as needed.    Historical Provider, MD  ibuprofen (ADVIL,MOTRIN) 800 MG tablet Take 0.5 tablets (400 mg total) by mouth 3 (three) times daily. 10/28/13   Linwood Dibbles, MD  methimazole (TAPAZOLE) 10 MG tablet Take 10 mg by mouth 3 (three) times daily.    Historical Provider, MD  methocarbamol (ROBAXIN) 500 MG tablet Take 2 tablets (1,000 mg total) by mouth 4 (four) times daily as needed (Pain). 02/28/14   Kimiye Strathman, PA-C  omeprazole (PRILOSEC) 20 MG capsule Take 20 mg by mouth daily.    Historical Provider, MD  polyethylene glycol (MIRALAX / GLYCOLAX) packet Take 17 g by mouth daily. 10/28/13   Linwood Dibbles, MD  TOPIRAMATE PO Take 25 mg by mouth.     Historical Provider, MD   BP 138/77  Pulse 71  Temp(Src) 98.1 F (36.7 C) (Oral)  Resp 18  Ht  (1.702 m)  Wt 190 lb (86.183 kg)  BMI 29.75 kg/m2  SpO2 99% Physical Exam  Nursing note and vitals reviewed. Constitutional: She is oriented to person, place, and time. She appears well-developed and well-nourished. No distress.  HENT:  Head: Normocephalic.  Eyes: Conjunctivae and EOM are normal.  Cardiovascular: Normal rate.  Pulmonary/Chest: Effort normal. No stridor.  Musculoskeletal: Normal range of motion.       Feet:  Neurological: She is alert and oriented to person, place, and time.  Psychiatric: She has a normal mood and affect.    ED Course  Procedures (including critical care time) Labs Review Labs Reviewed - No data to display  Imaging Review Dg Foot Complete Right  02/28/2014   CLINICAL DATA:  Stepped on loose gravel twisting right foot. Now with pain involving the top and lateral aspects of the right foot.  EXAM: RIGHT FOOT COMPLETE - 3+ VIEW  COMPARISON:  None.  FINDINGS: There is an old / healed fracture involving the distal diaphysis of the fourth metacarpal. No definite acute displaced fracture. Joint spaces are preserved. No dislocation. Regional  soft tissues appear normal.  IMPRESSION: 1. No acute findings. 2. Old/healed fracture involving the distal diaphysis of the fourth metacarpal.   Electronically Signed   By: Simonne Come M.D.   On: 02/28/2014 14:00     EKG Interpretation None      MDM   Final diagnoses:  Right ankle sprain, initial encounter    Filed Vitals:   02/28/14 1328  BP: 138/77  Pulse: 71  Temp: 98.1 F (36.7 C)  TempSrc: Oral  Resp: 18  Height:  (1.702 m)  Weight: 190 lb (86.183 kg)  SpO2: 99%    Medications  ibuprofen (ADVIL,MOTRIN) tablet 800 mg (800 mg Oral Given 02/28/14 1545)  methocarbamol (ROBAXIN) tablet 1,000 mg (1,000 mg Oral Given 02/28/14 1545)    [name] is a 43 y.o. female presenting with pain to right foot after slip and fall last evening. X-ray with no bony abnormalities. Patient will be given an Ace wrap, crutches and advised rest, ice, compression elevation. Sports medicine followup provided.  Evaluation does not show pathology that would require ongoing emergent intervention or inpatient treatment. Pt is hemodynamically stable and mentating appropriately. Discussed findings and plan with patient/guardian, who agrees with care plan. All questions answered. Return precautions discussed and outpatient follow up given.   New Prescriptions   METHOCARBAMOL (ROBAXIN) 500 MG TABLET    Take 2 tablets (1,000 mg total) by mouth 4 (four) times daily as needed (Pain).         Wynetta Emery, PA-C 02/28/14 1555

## 2014-11-18 IMAGING — CR DG ABDOMEN ACUTE W/ 1V CHEST
3 series · 3 of 3 positions shown · non-contrast
Comparison: None.

CLINICAL DATA: Left lower quadrant pain, constipation

EXAM:
ACUTE ABDOMEN SERIES (ABDOMEN 2 VIEW & CHEST 1 VIEW)

[w chest pa]
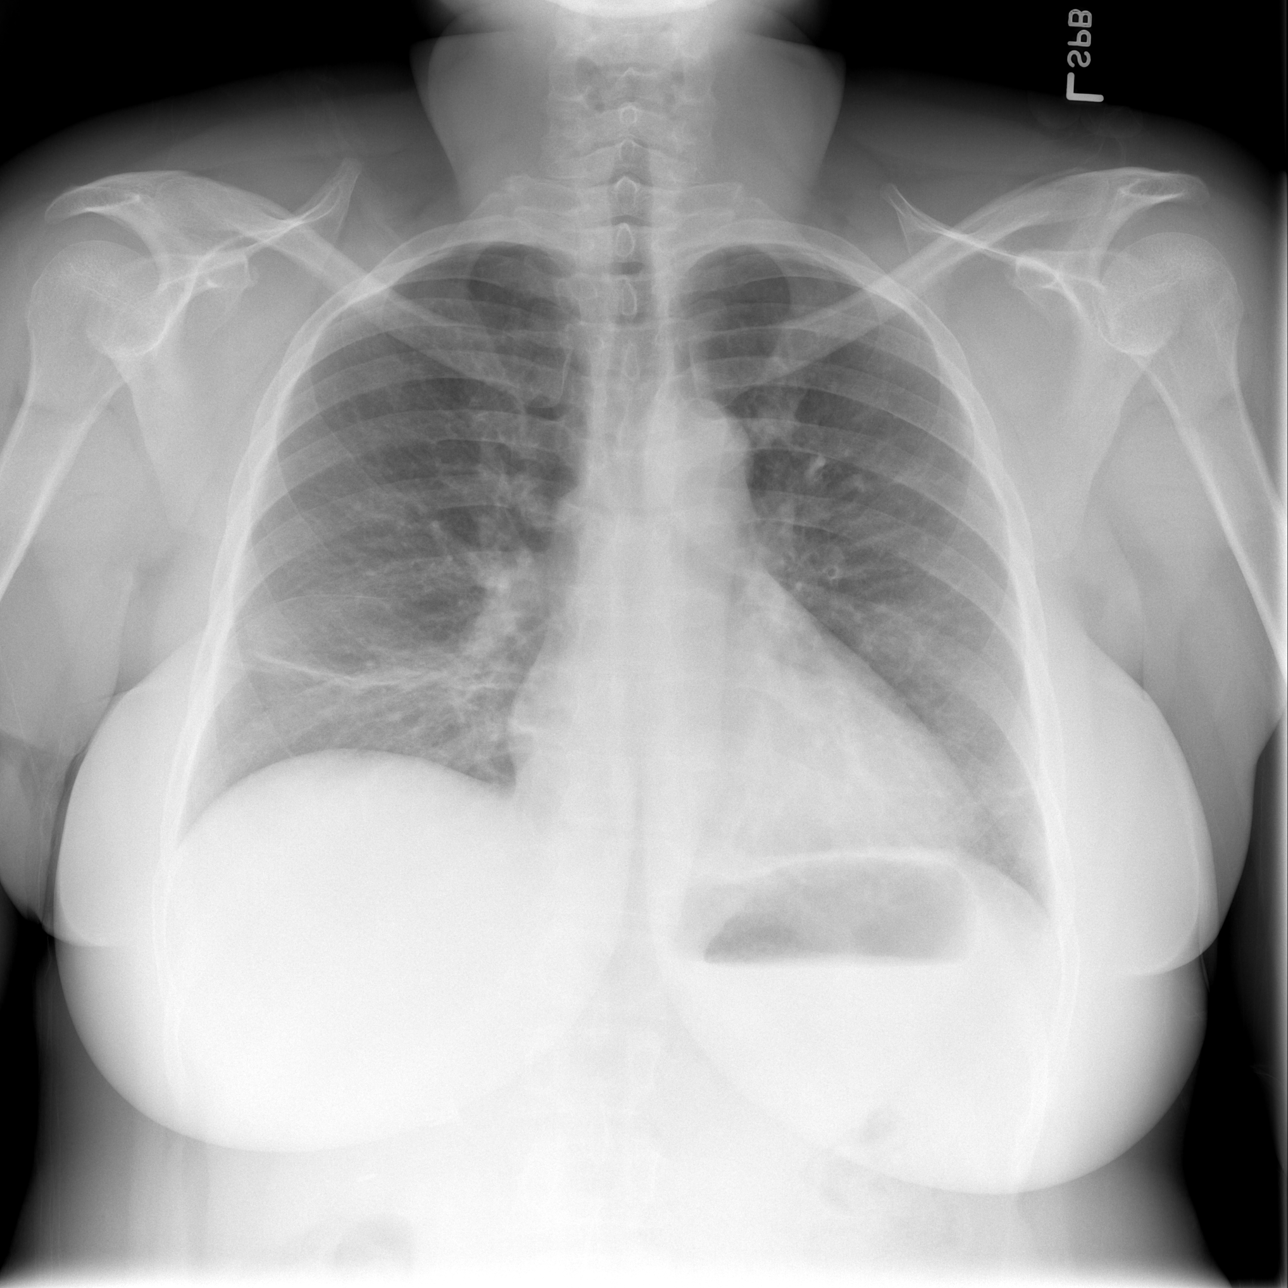

[w abdomen upright]
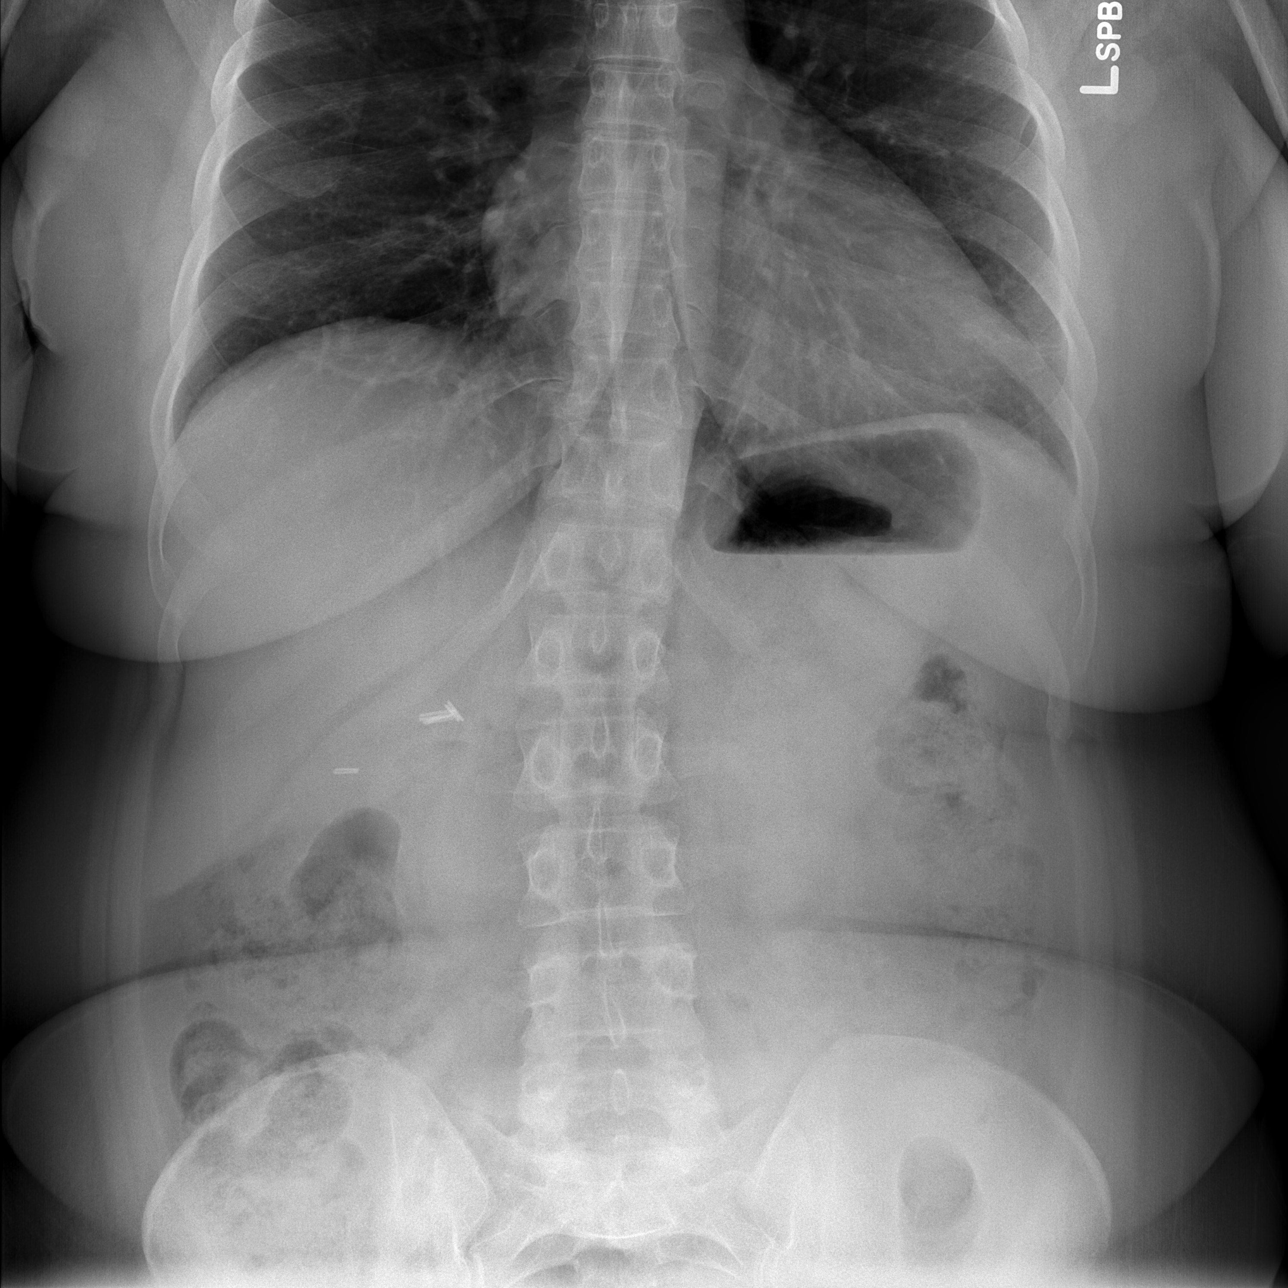

[t abdomen supine]
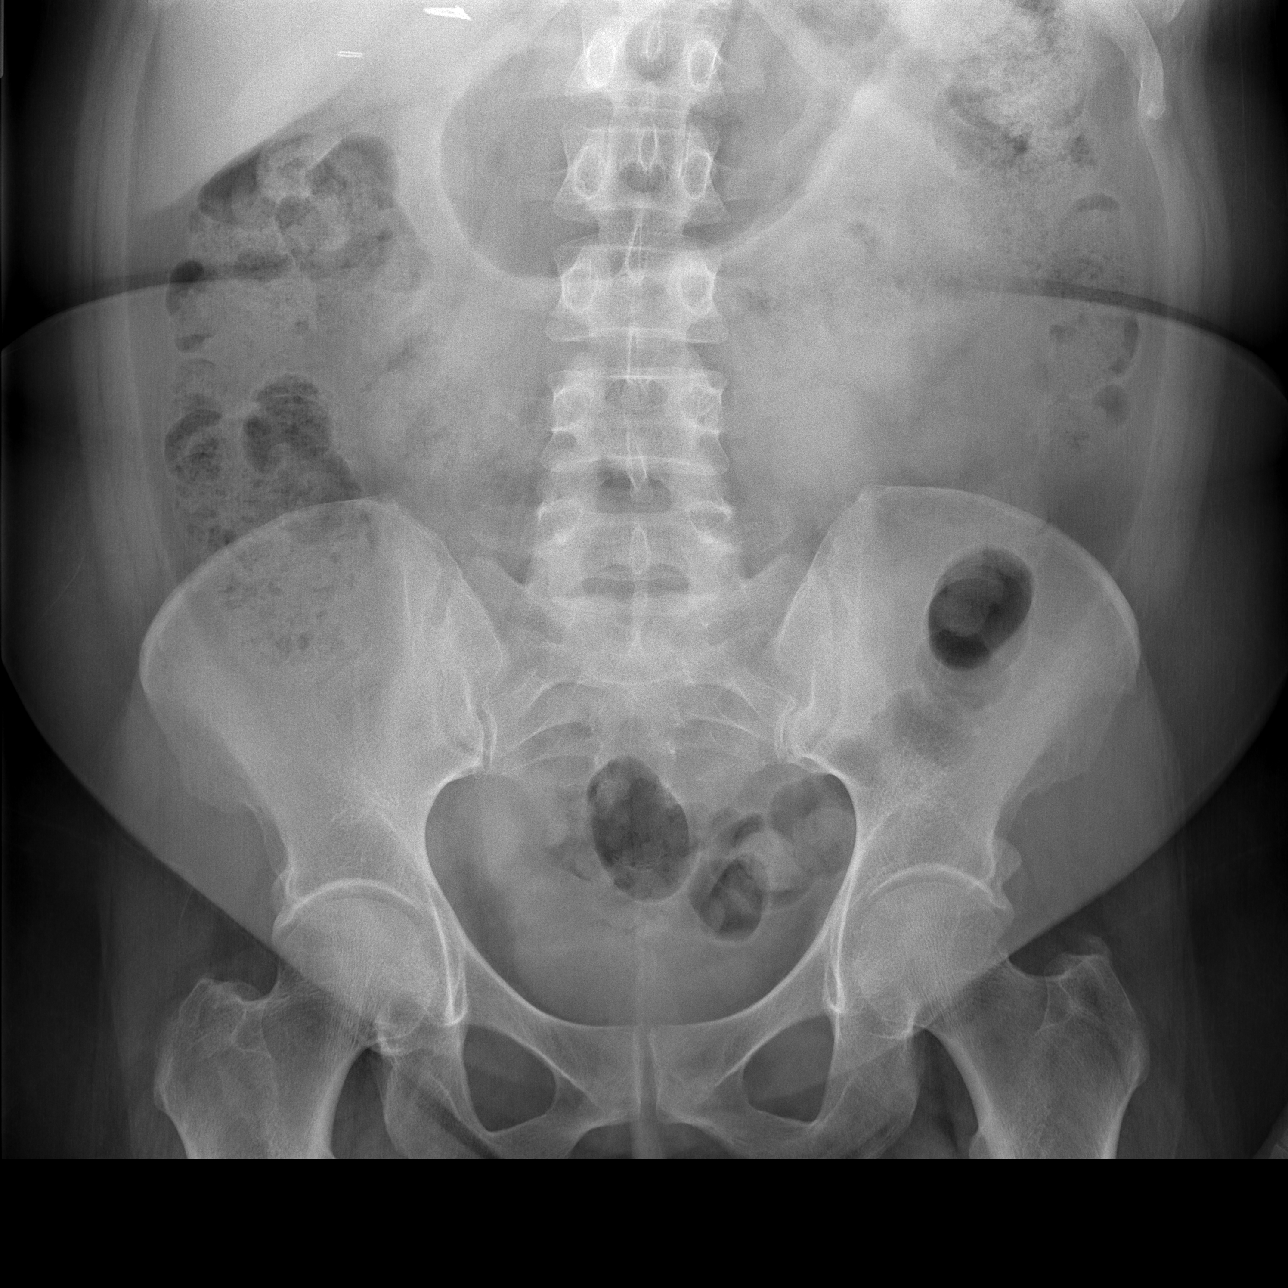

[3 of 3 positions shown; findings below may reference images not displayed]

FINDINGS: The heart size and vascular pattern are normal. There is mild left
lower lobe atelectatic change and there is mild scarring or
atelectatic change in the right lower lobe.

There is no free air. Right upper quadrant clips indicate prior
cholecystectomy. Large amount of stool retained throughout the
entire colon. There are no abnormally dilated loops of bowel or
abnormal air-fluid levels.
IMPRESSION: Significant fecal retention consistent with constipation.

## 2015-09-18 ENCOUNTER — Emergency Department (HOSPITAL_BASED_OUTPATIENT_CLINIC_OR_DEPARTMENT_OTHER)
Admission: EM | Admit: 2015-09-18 | Discharge: 2015-09-18 | Disposition: A | Discharge status: Home / Self Care | Payer: BC Managed Care – PPO | Attending: Emergency Medicine | Admitting: Emergency Medicine

## 2015-09-18 ENCOUNTER — Encounter (HOSPITAL_BASED_OUTPATIENT_CLINIC_OR_DEPARTMENT_OTHER): Payer: Self-pay | Admitting: Emergency Medicine

## 2015-09-18 DIAGNOSIS — E079 Disorder of thyroid, unspecified: Secondary | ICD-10-CM | POA: Insufficient documentation

## 2015-09-18 DIAGNOSIS — K08409 Partial loss of teeth, unspecified cause, unspecified class: Secondary | ICD-10-CM | POA: Diagnosis not present

## 2015-09-18 DIAGNOSIS — Z79899 Other long term (current) drug therapy: Secondary | ICD-10-CM | POA: Diagnosis not present

## 2015-09-18 DIAGNOSIS — Z880 Allergy status to penicillin: Secondary | ICD-10-CM | POA: Insufficient documentation

## 2015-09-18 DIAGNOSIS — K047 Periapical abscess without sinus: Secondary | ICD-10-CM

## 2015-09-18 DIAGNOSIS — Z791 Long term (current) use of non-steroidal anti-inflammatories (NSAID): Secondary | ICD-10-CM | POA: Insufficient documentation

## 2015-09-18 DIAGNOSIS — F17200 Nicotine dependence, unspecified, uncomplicated: Secondary | ICD-10-CM | POA: Diagnosis not present

## 2015-09-18 DIAGNOSIS — Z8679 Personal history of other diseases of the circulatory system: Secondary | ICD-10-CM | POA: Insufficient documentation

## 2015-09-18 DIAGNOSIS — R220 Localized swelling, mass and lump, head: Secondary | ICD-10-CM | POA: Diagnosis present

## 2015-09-18 MED ORDER — AMOXICILLIN-POT CLAVULANATE 875-125 MG PO TABS: 1.0000 | ORAL_TABLET | Freq: Two times a day (BID) | ORAL | Status: AC

## 2015-09-18 NOTE — ED Notes (Signed)
Pt reports wisdom tooth removed from left lower side on Monday, she noticed increased swelling and odd taste draining down throat also wants to be evaluated for infection and increased pain

## 2015-09-18 NOTE — Discharge Instructions (Signed)
Please contact her dentist and schedule follow-up evaluation. Please return if any new or worsening signs or symptoms present.  Dental Extraction A dental extraction is the removal (extraction) of a tooth. You may need to have a dental extraction if:   You have tooth decay or gum disease.  You have an infection (abscess).  Room needs to be made for other teeth to grow in or to be aligned properly.  Baby (primary) teeth are preventing adult (permanent) teeth from coming to the surface (erupting).  You have a tooth fracture or fractures that are not repairable.  You are going to be having radiation to your head and neck. The type and length of procedure that you have depends on the reason for the extraction and the placement of the tooth or teeth that are being removed. The procedure may be:  A simple extraction. This is done if the tooth is visible in the mouth and is above the gumline.  A surgical extraction. This is done if the tooth has not come into the mouth or if the tooth is broken off below the gumline. LET [facility] CARE PROVIDER KNOW ABOUT:  Any allergies you have.  All medicines you are taking, including vitamins, herbs, eye drops, creams, and over-the-counter medicines.  Previous problems you or members of your family have had with the use of anesthetics.  Any blood disorders you have.  Previous surgeries you have had.  Any medical conditions you may have. RISKS AND COMPLICATIONS Generally, this is a safe procedure. However, problems may occur, including:  Damage to surrounding teeth, nerves, tissues, or structures.  The blood clot does not form or stay in place where the tooth was removed. This causes the bones and nerves underneath to be exposed (dry socket). This can delay healing.  Incomplete extraction of roots.  Jawbone injury, pain, or weakness. BEFORE THE PROCEDURE  Ask your health care provider about:  Changing or stopping your regular medicines.  This is especially important if you are taking diabetes medicines or blood thinners.  Taking medicines such as aspirin and ibuprofen. These medicines can thin your blood. Do not take these medicines before your procedure if your health care provider instructs you not to.  Take medicines, such as antibiotic medicines, as directed by your health care provider.  Follow instructions from your health care provider about eating or drinking restrictions.  Plan to have someone take you home after the procedure.  If you go home right after the procedure, plan to have someone with you for 24 hours. PROCEDURE  You may be given one or more of the following:  A medicine that helps you relax (sedative).  A medicine that numbs the area (local anesthetic).  A medicine that makes you fall asleep (general anesthetic).  If you are having a simple extraction:  Your dentist will loosen the tooth with an instrument called an elevator.  Another instrument called forceps will be used to grasp the tooth and remove it from the socket.  The open socket will be cleaned.  Gauze will be placed in the socket to reduce bleeding.  If you are having a surgical extraction:  Your dentist will make an incision in the gum.  Some of the bone around the tooth may need to be removed.  The tooth will be removed.  Stitches (sutures) may be required to close the area. The procedure may vary among health care providers and hospitals. AFTER THE PROCEDURE  You may have gauze in your  mouth where the tooth was removed. If directed by your health care provider, apply gentle pressure on the gauze for up to one hour after the procedure. This will help to control bleeding.  A blood clot should begin to form over the open socket. This is normal. Do not touch the area, and do not rinse it.  You may be given medicines to help control pain and help your recovery.   This information is not intended to replace advice given to  you by your health care provider. Make sure you discuss any questions you have with your health care provider.   Document Released: 06/11/2005 Document Revised: 10/26/2014 Document Reviewed: 06/07/2014 Elsevier Interactive Patient Education [employer]2016 [employer]

## 2015-09-18 NOTE — ED Notes (Signed)
C/o L lower jaw pain and swelling, also drainage, bad taste, hot and cold chills, (denies: fever, nv), taking advil for pain, concerned about not having "antibiotic or nausea med". Alert, NAD, calm, interactive, no obvious bleeding or drainage, no dyspnea or dysphagia, swelling minimal. Taking ibuprofen for pain.

## 2015-09-18 NOTE — ED Provider Notes (Signed)
CSN: [id number]     Arrival date & time 09/18/15  1846 History   First MD Initiated Contact with Patient 09/18/15 2049     Chief Complaint  Patient presents with  . Oral Swelling   HPI   45 year old female presents today with complaints of oral pain. Patient reports that 6 days ago she had her bottom third molar extracted at the [place]. She reports some soreness and swelling after the extraction. She notes that on the 3 post extraction she started introducing food back into her diet, began smoking again. She notes that today she developed a bad taste in her mouth, increasing pain, and minor amount of swelling surrounding the extraction site. Patient notes she's had teeth pulled before, has suffered from dry socket, but denies this feels similar. Patient denies any swelling or extension of pain into the jaw or face, no swelling of the tongue or mouth. She denies any bleeding, fever, chills, nausea, vomiting, neck stiffness. [facility].   Past Medical History  Diagnosis Date  . Migraines   . Gastritis   . Thyroid disease    Past Surgical History  Procedure Laterality Date  . Abdominal hysterectomy    . Cholecystectomy    . Abdominal surgery     History reviewed. No pertinent family history. Social History  Substance Use Topics  . Smoking status: Current Every Day Smoker  . Smokeless tobacco: None  . Alcohol Use: Yes     Comment: occ   OB History    No data available     Review of Systems  All other systems reviewed and are negative.   Allergies  Codeine; Penicillins; and Percocet  Home Medications   Prior to Admission medications   Medication Sig Start Date End Date Taking? Authorizing Provider  Esomeprazole Magnesium (NEXIUM PO) Take by mouth.     Yes Historical Provider, MD  ibuprofen (ADVIL,MOTRIN) 800 MG tablet Take 0.5 tablets (400 mg total) by mouth 3 (three) times daily. 10/28/13  Yes Linwood Dibbles, MD  methimazole (TAPAZOLE)  10 MG tablet Take 10 mg by mouth 3 (three) times daily.   Yes Historical Provider, MD  omeprazole (PRILOSEC) 20 MG capsule Take 20 mg by mouth daily.   Yes Historical Provider, MD  TOPIRAMATE PO Take 25 mg by mouth.    Yes Historical Provider, MD  amoxicillin-clavulanate (AUGMENTIN) 875-125 MG tablet Take 1 tablet by mouth every 12 (twelve) hours. 09/18/15   Eyvonne Mechanic, PA-C  docusate calcium (SURFAK) 240 MG capsule Take 240 mg by mouth daily.    Historical Provider, MD  docusate sodium (COLACE) 100 MG capsule Take 1 capsule (100 mg total) by mouth every 12 (twelve) hours. 10/28/13   Linwood Dibbles, MD  methocarbamol (ROBAXIN) 500 MG tablet Take 2 tablets (1,000 mg total) by mouth 4 (four) times daily as needed (Pain). 02/28/14   Nicole Pisciotta, PA-C  polyethylene glycol (MIRALAX / GLYCOLAX) packet Take 17 g by mouth daily. 10/28/13   Linwood Dibbles, MD   BP 151/87 mmHg  Pulse 90  Temp(Src) 98.7 F (37.1 C) (Oral)  Resp 18  Ht  (1.676 m)  Wt 87.998 kg  BMI 31.33 kg/m2  SpO2 99%   Physical Exam  Constitutional: She is oriented to person, place, and time. She appears well-developed and well-nourished.  HENT:  Head: Normocephalic and atraumatic.  Minor amount of swelling surrounding the extraction site, no purulence noted. Tenderness to palpation. No abscess noted, floor mouth  is soft, tongue without signs of infection, no deep space involvement   Eyes: Conjunctivae are normal. Pupils are equal, round, and reactive to light. Right eye exhibits no discharge. Left eye exhibits no discharge. No scleral icterus.  Neck: Normal range of motion. No JVD present. No tracheal deviation present.  Pulmonary/Chest: Effort normal. No stridor.  Neurological: She is alert and oriented to person, place, and time. Coordination normal.  Psychiatric: She has a normal mood and affect. Her behavior is normal. Judgment and thought content normal.  Nursing note and vitals reviewed.   ED Course  Procedures (including  critical care time) Labs Review Labs Reviewed - No data to display  Imaging Review No results found. I have personally reviewed and evaluated these images and lab results as part of my medical decision-making.   EKG Interpretation None      MDM   Final diagnoses:  Dental infection    Labs:  Imaging:  Consults:  Therapeutics:  Discharge Meds: Augmentin  Assessment/Plan: [age] female presents today with complaints of oral swelling, pain post extraction. Patient has very minor amount of surrounding swelling to the extraction site. I informed patient this was likely dry socket due to the pain, poor taste. Patient adamantly denies that this is the correct diagnosis, and is requesting antibiotics. She does have a minor amount of swelling, has poor follow-up with her today. Patient will be started on antibiotics at this time. She is penicillin allergic, she will be started on Augmentin. She is instructed [facility]. She is given strict return cautioned, verbalized understanding and agreement today's plan had no further questions or concerns at time of discharge         Eyvonne MechanicJeffrey Mando Blatz, PA-C 09/19/15 1555  Eyvonne MechanicJeffrey Khrystian Schauf, PA-C 09/19/15 2000  Doug SouSam Jacubowitz, MD 09/20/15 1312

## 2015-09-29 ENCOUNTER — Encounter (HOSPITAL_BASED_OUTPATIENT_CLINIC_OR_DEPARTMENT_OTHER): Payer: Self-pay | Admitting: *Deleted

## 2015-09-29 ENCOUNTER — Emergency Department (HOSPITAL_BASED_OUTPATIENT_CLINIC_OR_DEPARTMENT_OTHER)
Admission: EM | Admit: 2015-09-29 | Discharge: 2015-09-30 | Disposition: A | Discharge status: Home / Self Care | Payer: BC Managed Care – PPO | Attending: Emergency Medicine | Admitting: Emergency Medicine

## 2015-09-29 DIAGNOSIS — Z79899 Other long term (current) drug therapy: Secondary | ICD-10-CM | POA: Insufficient documentation

## 2015-09-29 DIAGNOSIS — F17200 Nicotine dependence, unspecified, uncomplicated: Secondary | ICD-10-CM | POA: Diagnosis not present

## 2015-09-29 DIAGNOSIS — Y9289 Other specified places as the place of occurrence of the external cause: Secondary | ICD-10-CM | POA: Diagnosis not present

## 2015-09-29 DIAGNOSIS — Y9389 Activity, other specified: Secondary | ICD-10-CM | POA: Insufficient documentation

## 2015-09-29 DIAGNOSIS — S40212A Abrasion of left shoulder, initial encounter: Secondary | ICD-10-CM | POA: Insufficient documentation

## 2015-09-29 DIAGNOSIS — S50812A Abrasion of left forearm, initial encounter: Secondary | ICD-10-CM | POA: Insufficient documentation

## 2015-09-29 DIAGNOSIS — Y999 Unspecified external cause status: Secondary | ICD-10-CM | POA: Insufficient documentation
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
## 2015-09-30 NOTE — Discharge Instructions (Signed)
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
## 2024-01-08 NOTE — Therapy (Unsigned)
 OUTPATIENT OCCUPATIONAL THERAPY ORTHO EVALUATION  Patient Name: [name] MRN: [id number] DOB:21-Dec-1970, 53 y.o., female Today's Date: 01/09/2024  PCP: Dr. Catalina REFERRING PROVIDER: Dr. Dorsey Combes  END OF SESSION:  OT End of Session - 01/09/24 0809     Visit Number 1    Number of Visits 15    Date for OT Re-Evaluation 04/02/24    [facility] Type [facility]    Authorization - Visit Number 1    Authorization - Number of Visits 15    Progress Note Due on Visit 10    OT Start Time 0804    OT Stop Time 0845    OT Time Calculation (min) 41 min          Past Medical History:  Diagnosis Date   Gastritis    Migraines    Thyroid  disease    Past Surgical History:  Procedure Laterality Date   ABDOMINAL HYSTERECTOMY     ABDOMINAL SURGERY     CHOLECYSTECTOMY     There are no active problems to display for this patient.   ONSET DATE: 10/18/23- CTR surgery  REFERRING DIAG: left wrist pain, s/p CTR  THERAPY DIAG:  Pain in left hand - Plan: Ot plan of care cert/re-cert  Pain in left wrist - Plan: Ot plan of care cert/re-cert  Muscle weakness (generalized) - Plan: Ot plan of care cert/re-cert  Localized edema - Plan: Ot plan of care cert/re-cert  Rationale for Evaluation and Treatment: Rehabilitation  SUBJECTIVE:   SUBJECTIVE STATEMENT: Pt reports significant pain in left hand Pt accompanied by: self  PERTINENT HISTORY: s/p L carpal tunnel release 10/18/23, see above for medical history  PRECAUTIONS: None    WEIGHT BEARING RESTRICTIONS: No  PAIN:  Are you having pain? Yes: NPRS scale: 6/10 Pain location: wrist Pain description: aching, burning Aggravating factors: unknown Relieving factors: time,   FALLS: Has patient fallen in last 6 months? No  LIVING ENVIRONMENT: Lives with: lives with their spouse Lives in: House/apartment   PLOF: Independent  PATIENT GOALS: decrease pain in LUE, improve use  NEXT MD VISIT: unknown  OBJECTIVE:   Note: Objective measures were completed at Evaluation unless otherwise noted.  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  FUNCTIONAL OUTCOME MEASURES: Quick Dash: 42.5% impairment  UPPER EXTREMITY ROM:     Active ROM Right eval Left eval  Shoulder flexion    Shoulder abduction    Shoulder adduction    Shoulder extension    Shoulder internal rotation    Shoulder external rotation    Elbow flexion    Elbow extension    Wrist flexion  60  Wrist extension  40  Wrist ulnar deviation    Wrist radial deviation    Wrist pronation    Wrist supination    (Blank rows = not tested)  Active ROM Right eval Left eval  Thumb MCP (0-60)  45  Thumb IP (0-80)  60  Thumb Radial abd/add (0-55)     Thumb Palmar abd/add (0-45)     Thumb Opposition to Small Finger     Index MCP (0-90)     Index PIP (0-100)     Index DIP (0-70)      Long MCP (0-90)      Long PIP (0-100)      Long DIP (0-70)      Ring MCP (0-90)  Ring PIP (0-100)      Ring DIP (0-70)      Little MCP (0-90)      Little PIP (0-100)      Little DIP (0-70)      (Blank rows = not tested)  Pt demonstrates grossly 80% composite finger flexion due to pain, full extension for LUE.  HAND FUNCTION: Grip strength: Right: 50 lbs; Left: 10 lbs   SENSATION: Light touch: Impaired  palm and fingertips of LUE, pt with healed inccion at wrist. Pt reports pillar pain and she developed redness at ulnar side of palm following US  and exercises. Pt reports her palm changes color with exercise and use and she has intermittnat swelling.  EDEMA: intermittant swelling  COGNITION: Overall cognitive status: Within functional limits for tasks assessed   OBSERVATIONS: Pleasant female with significant pain in L hand and wrist   TREATMENT DATE: 01/09/24 -eval, see pt education                                                                                                                        Modalities: Ultrasound:  Time: 8 mins Location: left volar wrist and palm 3.3 mhz, 0.8 w/cm 2, 20%, for pain and swelling, no adverse reactions       PATIENT EDUCATION: Education details:role of OT, potential goals, initial HEP from [place] protocol, 10 reps each, importance of massage desensitization. Person educated: Patient Education method: Explanation, Demonstration, Verbal cues, and Handouts Education comprehension: verbalized understanding, returned demonstration, and verbal cues required  HOME EXERCISE PROGRAM: 01/09/24- CT release exercises from [place] protocol.   GOALS: Goals reviewed with patient? Yes  SHORT TERM GOALS: Target date: 02/09/24  I with inital HEP  Goal status: INITIAL  2.  Pt will report LUE pain no greater than 4/10 for ADLs/ light use.  Goal status: INITIAL  3.  Pt will demonstrate ability to oppose all digits with thumb. Baseline: able to oppose only digits 2,3 Goal status: INITIAL  4.  Pt will increase thumb MP flexion to 55* or greater for increased functional use. Baseline: 45 Goal status: INITIAL  5.  Pt will increase thumb IP flexion to 70* or greater for increased functional use Baseline: 60 Goal status: INITIAL  6.  Pt will demonstrate at least 95% composite finger flexion for LUE. Baseline: 80% Goal status: INITIAL  LONG TERM GOALS: Target date: 04/02/24  I with updated HEP.  Goal status: INITIAL  2.  Pt will report  LUE pain no greater than 3/10 for ADLS/IADLs. Baseline: 6/10 Goal status: INITIAL  3.  Pt will improve Quick DASH score to 35% or better impairment Baseline: 42.5% Goal status: INITIAL  4.  Pt will demonstrate LUE grip strength of at least 25lbs for increased funcitonal use. Baseline: RUE 50 lbs, LUE10 lbs Goal status: INITIAL   ASSESSMENT:  CLINICAL IMPRESSION: Patient is a 53 y.o. female who was seen today for occupational therapy evaluation for L  hand weakness, s/p carpal tunnel release  in April 2025. Pt presents with the perfromance deficits below. She canbenefit from skilled occupational therapy to address these deficits in order to maximize pt's safety and I with ADLS/IADLS and to improve quality of life.  PERFORMANCE DEFICITS: in functional skills including ADLs, IADLs, coordination, dexterity, sensation, edema, ROM, strength, pain, flexibility, Fine motor control, Gross motor control, endurance, and UE functional use,  and psychosocial skills including coping strategies, environmental adaptation, habits, interpersonal interactions, and routines and behaviors.   IMPAIRMENTS: are limiting patient from ADLs, IADLs, rest and sleep, work, play, leisure, and social participation.   COMORBIDITIES: has no other co-morbidities that affects occupational performance. Patient will benefit from skilled OT to address above impairments and improve overall function.  MODIFICATION OR ASSISTANCE TO COMPLETE EVALUATION: No modification of tasks or assist necessary to complete an evaluation.  OT OCCUPATIONAL PROFILE AND HISTORY: Detailed assessment: Review of records and additional review of physical, cognitive, psychosocial history related to current functional performance.  CLINICAL DECISION MAKING: LOW - limited treatment options, no task modification necessary  REHAB POTENTIAL: Good  EVALUATION COMPLEXITY: Low      PLAN:  OT FREQUENCY: 15 visits  OT DURATION: 12 weeks  PLANNED INTERVENTIONS: 97168 OT Re-evaluation, 97535 self care/ADL training, 02889 therapeutic exercise, 97530 therapeutic activity, 97112 neuromuscular re-education, 97140 manual therapy, 97035 ultrasound, 97018 paraffin, 02989 moist heat, 97010 cryotherapy, 97014 electrical stimulation unattended, scar mobilization, passive range of motion, energy conservation, coping strategies training, patient/family education, and DME and/or AE instructions  RECOMMENDED OTHER SERVICES:  n/a  CONSULTED AND AGREED WITH PLAN OF CARE: Patient  PLAN FOR NEXT SESSION: review HEP, US , paraffin, putty when pain is better   Rhonna Holster, OT 01/09/2024, 9:22 AM

## 2024-01-09 ENCOUNTER — Ambulatory Visit: Attending: Orthopedic Surgery | Admitting: Occupational Therapy

## 2024-01-09 ENCOUNTER — Encounter: Payer: Self-pay | Admitting: Occupational Therapy

## 2024-01-09 ENCOUNTER — Other Ambulatory Visit: Payer: Self-pay

## 2024-01-09 DIAGNOSIS — M6281 Muscle weakness (generalized): Secondary | ICD-10-CM | POA: Insufficient documentation

## 2024-01-09 DIAGNOSIS — M25532 Pain in left wrist: Secondary | ICD-10-CM | POA: Diagnosis present

## 2024-01-09 DIAGNOSIS — R600 Localized edema: Secondary | ICD-10-CM | POA: Diagnosis present

## 2024-01-09 DIAGNOSIS — M79642 Pain in left hand: Secondary | ICD-10-CM | POA: Diagnosis present

## 2024-01-14 ENCOUNTER — Encounter: Payer: Self-pay | Admitting: Occupational Therapy

## 2024-01-14 ENCOUNTER — Ambulatory Visit: Admitting: Occupational Therapy

## 2024-01-14 DIAGNOSIS — M6281 Muscle weakness (generalized): Secondary | ICD-10-CM

## 2024-01-14 DIAGNOSIS — M79642 Pain in left hand: Secondary | ICD-10-CM

## 2024-01-14 DIAGNOSIS — R600 Localized edema: Secondary | ICD-10-CM

## 2024-01-14 DIAGNOSIS — M25532 Pain in left wrist: Secondary | ICD-10-CM

## 2024-01-14 NOTE — Patient Instructions (Signed)
 Composite Extension (Passive Flexor Stretch)    Sitting with elbows on table and palms together, slowly lower wrists toward table until stretch is felt. Be sure to keep palms together throughout stretch. Hold ____ seconds. Relax. Repeat _5-10___ times. Do __2__ sessions per day.  Copyright  VHI. All rights reserved.

## 2024-01-14 NOTE — Therapy (Signed)
 OUTPATIENT OCCUPATIONAL THERAPY ORTHO EVALUATION  Patient Name: [name] MRN: [id number] DOB:[birth year], 53 y.o., female Today's Date: 01/14/2024  PCP: Dr. Catalina REFERRING PROVIDER: Dr. Dorsey Combes  END OF SESSION:    Past Medical History:  Diagnosis Date   Gastritis    Migraines    Thyroid  disease    Past Surgical History:  Procedure Laterality Date   ABDOMINAL HYSTERECTOMY     ABDOMINAL SURGERY     CHOLECYSTECTOMY     There are no active problems to display for this patient.   ONSET DATE: 10/18/23- CTR surgery  REFERRING DIAG: left wrist pain, s/p CTR  THERAPY DIAG:  Pain in left hand  Pain in left wrist  Muscle weakness (generalized)  Localized edema  Rationale for Evaluation and Treatment: Rehabilitation  SUBJECTIVE:   SUBJECTIVE STATEMENT: Pt reports significant pain in left hand Pt accompanied by: self  PERTINENT HISTORY: s/p L carpal tunnel release 10/18/23, see above for medical history  PRECAUTIONS: None    WEIGHT BEARING RESTRICTIONS: No  PAIN:  Are you having pain? Yes: NPRS scale: 6/10 Pain location: wrist Pain description: aching, burning Aggravating factors: unknown Relieving factors: time,   FALLS: Has patient fallen in last 6 months? No  LIVING ENVIRONMENT: Lives with: lives with their spouse Lives in: House/apartment   PLOF: Independent  PATIENT GOALS: decrease pain in LUE, improve use  NEXT MD VISIT: unknown  OBJECTIVE:  Note: Objective measures were completed at Evaluation unless otherwise noted.  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  FUNCTIONAL OUTCOME MEASURES: Quick Dash: 42.5% impairment  UPPER EXTREMITY ROM:     Active ROM Right eval Left eval  Shoulder flexion    Shoulder abduction    Shoulder adduction    Shoulder extension    Shoulder internal rotation    Shoulder external rotation     Elbow flexion    Elbow extension    Wrist flexion  60  Wrist extension  40  Wrist ulnar deviation    Wrist radial deviation    Wrist pronation    Wrist supination    (Blank rows = not tested)  Active ROM Right eval Left eval  Thumb MCP (0-60)  45  Thumb IP (0-80)  60  Thumb Radial abd/add (0-55)     Thumb Palmar abd/add (0-45)     Thumb Opposition to Small Finger     Index MCP (0-90)     Index PIP (0-100)     Index DIP (0-70)      Long MCP (0-90)      Long PIP (0-100)      Long DIP (0-70)      Ring MCP (0-90)      Ring PIP (0-100)      Ring DIP (0-70)      Little MCP (0-90)      Little PIP (0-100)      Little DIP (0-70)      (Blank rows = not tested)  Pt demonstrates grossly 80% composite finger flexion due to pain, full extension for LUE.  HAND FUNCTION: Grip strength: Right: 50 lbs; Left: 10 lbs   SENSATION: Light touch: Impaired  palm and fingertips of LUE, pt with healed inccion at wrist. Pt reports pillar pain and she developed redness at ulnar side of palm following US  and exercises. Pt reports her palm changes color with exercise and use and she has  intermittnat swelling.  EDEMA: intermittant swelling  COGNITION: Overall cognitive status: Within functional limits for tasks assessed   OBSERVATIONS: Pleasant female with significant pain in L hand and wrist   TREATMENT DATE: 01/14/24- Paraffin x 8 mins to LUE for pain and stiffness. No adverse reactions 01/09/24 -eval, see pt education                                                                                                                              PATIENT EDUCATION: Education details:see above Person educated: Patient Education method: Explanation, Demonstration, Verbal cues, and Handouts Education comprehension: verbalized understanding, returned demonstration, and verbal cues required  HOME EXERCISE PROGRAM: 01/09/24- CT release exercises from [facility] protocol.   GOALS: Goals  reviewed with patient? Yes  SHORT TERM GOALS: Target date: 02/09/24  I with inital HEP  Goal status: met, 01/14/24  2.  Pt will report LUE pain no greater than 4/10 for ADLs/ light use.  Goal status: ongoing, 01/14/24  3.  Pt will demonstrate ability to oppose all digits with thumb. Baseline: able to oppose only digits 2,3 Goal status: ongoing 01/14/24  4.  Pt will increase thumb MP flexion to 55* or greater for increased functional use. Baseline: 45 Goal status: ongoing 01/14/24  5.  Pt will increase thumb IP flexion to 70* or greater for increased functional use Baseline: 60 Goal status: INongoing 7  6.  Pt will demonstrate at least 95% composite finger flexion for LUE. Baseline: 80% Goal status: INITIAL  LONG TERM GOALS: Target date: 04/02/24  I with updated HEP.  Goal status: INITIAL  2.  Pt will report  LUE pain no greater than 3/10 for ADLS/IADLs. Baseline: 6/10 Goal status: INITIAL  3.  Pt will improve Quick DASH score to 35% or better impairment Baseline: 42.5% Goal status: INITIAL  4.  Pt will demonstrate LUE grip strength of at least 25lbs for increased funcitonal use. Baseline: RUE 50 lbs, LUE10 lbs Goal status: INITIAL   ASSESSMENT:  CLINICAL IMPRESSION: Patient is a 53 y.o. female who was seen today for occupational therapy evaluation for L hand weakness, s/p carpal tunnel release in April 2025. Pt presents with the perfromance deficits below. She canbenefit from skilled occupational therapy to address these deficits in order to maximize pt's safety and I with ADLS/IADLS and to improve quality of life.  PERFORMANCE DEFICITS: in functional skills including ADLs, IADLs, coordination, dexterity, sensation, edema, ROM, strength, pain, flexibility, Fine motor control, Gross motor control, endurance, and UE functional use,  and psychosocial skills including coping strategies, environmental adaptation, habits, interpersonal interactions, and routines and behaviors.    IMPAIRMENTS: are limiting patient from ADLs, IADLs, rest and sleep, work, play, leisure, and social participation.   COMORBIDITIES: has no other co-morbidities that affects occupational performance. Patient will benefit from skilled OT to address above impairments and improve overall function.  MODIFICATION OR ASSISTANCE TO COMPLETE EVALUATION: No modification of tasks or assist necessary to complete  an evaluation.  OT OCCUPATIONAL PROFILE AND HISTORY: Detailed assessment: Review of records and additional review of physical, cognitive, psychosocial history related to current functional performance.  CLINICAL DECISION MAKING: LOW - limited treatment options, no task modification necessary  REHAB POTENTIAL: Good  EVALUATION COMPLEXITY: Low      PLAN:  OT FREQUENCY: 15 visits  OT DURATION: 12 weeks  PLANNED INTERVENTIONS: 97168 OT Re-evaluation, 97535 self care/ADL training, 02889 therapeutic exercise, 97530 therapeutic activity, 97112 neuromuscular re-education, 97140 manual therapy, 97035 ultrasound, 97018 paraffin, 02989 moist heat, 97010 cryotherapy, 97014 electrical stimulation unattended, scar mobilization, passive range of motion, energy conservation, coping strategies training, patient/family education, and DME and/or AE instructions  RECOMMENDED OTHER SERVICES: n/a  CONSULTED AND AGREED WITH PLAN OF CARE: Patient  PLAN FOR NEXT SESSION: review HEP, US , paraffin, putty when pain is better   Chriss Mannan, OT 01/14/2024, 8:58 AM

## 2024-01-14 NOTE — Therapy (Signed)
 OUTPATIENT OCCUPATIONAL THERAPY ORTHO EVALUATION  Patient Name: [name] MRN: [id number] DOB:09-14-1970, 53 y.o., female Today's Date: 01/14/2024  PCP: Dr. Catalina REFERRING PROVIDER: Dr. Dorsey Combes  END OF SESSION:  OT End of Session - 01/14/24 1206     Visit Number 2    Number of Visits 15    Date for OT Re-Evaluation 04/02/24    [facility]ization Type [facility]    Authorization - Visit Number 2    Authorization - Number of Visits 15    Progress Note Due on Visit 10    OT Start Time [phone]    OT Stop Time 0932    OT Time Calculation (min) 39 min          Past Medical History:  Diagnosis Date   Gastritis    Migraines    Thyroid  disease    Past Surgical History:  Procedure Laterality Date   ABDOMINAL HYSTERECTOMY     ABDOMINAL SURGERY     CHOLECYSTECTOMY     There are no active problems to display for this patient.   ONSET DATE: 10/18/23- CTR surgery  REFERRING DIAG: left wrist pain, s/p CTR  THERAPY DIAG:  Pain in left hand  Pain in left wrist  Muscle weakness (generalized)  Localized edema  Rationale for Evaluation and Treatment: Rehabilitation  SUBJECTIVE:   SUBJECTIVE STATEMENT: Pt reports significant pain in left hand Pt accompanied by: self  PERTINENT HISTORY: s/p L carpal tunnel release 10/18/23, see above for medical history  PRECAUTIONS: None    WEIGHT BEARING RESTRICTIONS: No  PAIN:  Are you having pain? Yes: NPRS scale: 6/10 Pain location: wrist Pain description: aching, burning Aggravating factors: unknown Relieving factors: time,   FALLS: Has patient fallen in last 6 months? No  LIVING ENVIRONMENT: Lives with: lives with their spouse Lives in: House/apartment   PLOF: Independent  PATIENT GOALS: decrease pain in LUE, improve use  NEXT MD VISIT: unknown  OBJECTIVE:  Note: Objective measures were completed at Evaluation unless otherwise noted.  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  FUNCTIONAL OUTCOME MEASURES: Quick Dash: 42.5% impairment  UPPER EXTREMITY ROM:     Active ROM Right eval Left eval  Shoulder flexion    Shoulder abduction    Shoulder adduction    Shoulder extension    Shoulder internal rotation    Shoulder external rotation    Elbow flexion    Elbow extension    Wrist flexion  60  Wrist extension  40  Wrist ulnar deviation    Wrist radial deviation    Wrist pronation    Wrist supination    (Blank rows = not tested)  Active ROM Right eval Left eval  Thumb MCP (0-60)  45  Thumb IP (0-80)  60  Thumb Radial abd/add (0-55)     Thumb Palmar abd/add (0-45)     Thumb Opposition to Small Finger     Index MCP (0-90)     Index PIP (0-100)     Index DIP (0-70)      Long MCP (0-90)      Long PIP (0-100)      Long DIP (0-70)      Ring MCP (0-90)      Ring PIP (0-100)      Ring DIP (0-70)      Little MCP (0-90)      Little PIP (0-100)  Little DIP (0-70)      (Blank rows = not tested)  Pt demonstrates grossly 80% composite finger flexion due to pain, full extension for LUE.  HAND FUNCTION: Grip strength: Right: 50 lbs; Left: 10 lbs   SENSATION: Light touch: Impaired  palm and fingertips of LUE, pt with healed inccion at wrist. Pt reports pillar pain and she developed redness at ulnar side of palm following US  and exercises. Pt reports her palm changes color with exercise and use and she has intermittnat swelling.  EDEMA: intermittant swelling  COGNITION: Overall cognitive status: Within functional limits for tasks assessed   OBSERVATIONS: Pleasant female with significant pain in L hand and wrist   TREATMENT DATE: Paraffin x 8 mins to LUE for pain and stiffness. Reviewed previously issued HEP from [facility] protocol 10 reps each, added prayer stretch. Soft tissue mobs, massage to palm and incision.                                                                                                                Modalities:Ultrasound:  Time: 8 mins Location: left volar wrist and palm 3.3 mhz, 0.8 w/cm 2, 20%, for pain and swelling, no adverse reactions    01/09/24 -eval, see pt education                                                                                                                       Ultrasound:  Time: 8 mins Location: left volar wrist and palm 3.3 mhz, 0.8 w/cm 2, 20%, for pain and swelling, no adverse reactions   PATIENT EDUCATION: Education details:reviewed initial HEP from [facility] protocol, 10 reps each, Person educated: Patient Education method: demonstration, v.c Education comprehension: verbalized understanding, returned demonstration, and verbal cues required  HOME EXERCISE PROGRAM: 01/09/24- CT release exercises from [facility] protocol.   GOALS: Goals reviewed with patient? Yes  SHORT TERM GOALS: Target date: 02/09/24  I with inital HEP  Goal status: met, 01/14/24  2.  Pt will report LUE pain no greater than 4/10 for ADLs/ light use.  Goal status: ongoing, 4-7/10 01/14/24  3.  Pt will demonstrate ability to oppose all digits with thumb. Baseline: able to oppose only digits 2,3 Goal status: INITIAL  4.  Pt will increase thumb MP flexion to 55* or greater for increased functional use. Baseline: 45 Goal status: INITIAL  5.  Pt will increase thumb IP flexion to 70* or greater for increased functional use Baseline: 60 Goal status: INITIAL  6.  Pt will demonstrate at  least 95% composite finger flexion for LUE. Baseline: 80% Goal status: INITIAL  LONG TERM GOALS: Target date: 04/02/24  I with updated HEP.  Goal status: INITIAL  2.  Pt will report  LUE pain no greater than 3/10 for ADLS/IADLs. Baseline: 6/10 Goal status: INITIAL  3.  Pt will improve Quick DASH score to 35% or better impairment Baseline: 42.5% Goal status: INITIAL  4.  Pt will demonstrate LUE grip strength of at least 25lbs  for increased funcitonal use. Baseline: RUE 50 lbs, LUE10 lbs Goal status: INITIAL   ASSESSMENT:  CLINICAL IMPRESSION: Patient is progressing towards goals. She demonstrates understanding of HEP however remains limited by pain.PERFORMANCE DEFICITS: in functional skills including ADLs, IADLs, coordination, dexterity, sensation, edema, ROM, strength, pain, flexibility, Fine motor control, Gross motor control, endurance, and UE functional use,  and psychosocial skills including coping strategies, environmental adaptation, habits, interpersonal interactions, and routines and behaviors.   IMPAIRMENTS: are limiting patient from ADLs, IADLs, rest and sleep, work, play, leisure, and social participation.   COMORBIDITIES: has no other co-morbidities that affects occupational performance. Patient will benefit from skilled OT to address above impairments and improve overall function.  MODIFICATION OR ASSISTANCE TO COMPLETE EVALUATION: No modification of tasks or assist necessary to complete an evaluation.  OT OCCUPATIONAL PROFILE AND HISTORY: Detailed assessment: Review of records and additional review of physical, cognitive, psychosocial history related to current functional performance.  CLINICAL DECISION MAKING: LOW - limited treatment options, no task modification necessary  REHAB POTENTIAL: Good  EVALUATION COMPLEXITY: Low      PLAN:  OT FREQUENCY: 15 visits  OT DURATION: 12 weeks  PLANNED INTERVENTIONS: 97168 OT Re-evaluation, 97535 self care/ADL training, 02889 therapeutic exercise, 97530 therapeutic activity, 97112 neuromuscular re-education, 97140 manual therapy, 97035 ultrasound, 97018 paraffin, 02989 moist heat, 97010 cryotherapy, 97014 electrical stimulation unattended, scar mobilization, passive range of motion, energy conservation, coping strategies training, patient/family education, and DME and/or AE instructions  RECOMMENDED OTHER SERVICES: n/a  CONSULTED AND AGREED WITH  PLAN OF CARE: Patient  PLAN FOR NEXT SESSION: review HEP,  paraffin beginning of session, consider US  end of session, putty when pain is better   Trishia Cuthrell, OT 01/14/2024, 12:09 PM

## 2024-01-16 ENCOUNTER — Ambulatory Visit: Admitting: Occupational Therapy

## 2024-01-21 ENCOUNTER — Ambulatory Visit: Admitting: Occupational Therapy

## 2024-01-21 ENCOUNTER — Encounter: Payer: Self-pay | Admitting: Occupational Therapy

## 2024-01-21 DIAGNOSIS — M79642 Pain in left hand: Secondary | ICD-10-CM | POA: Diagnosis not present

## 2024-01-21 DIAGNOSIS — R600 Localized edema: Secondary | ICD-10-CM

## 2024-01-21 DIAGNOSIS — M6281 Muscle weakness (generalized): Secondary | ICD-10-CM

## 2024-01-21 DIAGNOSIS — M25532 Pain in left wrist: Secondary | ICD-10-CM

## 2024-01-21 NOTE — Therapy (Signed)
 OUTPATIENT OCCUPATIONAL THERAPY ORTHO EVALUATION  Patient Name: [name] MRN: [id number] DOB:[birth year], 53 y.o., female Today's Date: 01/21/2024  PCP: Dr. Catalina REFERRING PROVIDER: Dr. Dorsey Combes  END OF SESSION:  OT End of Session - 01/21/24 0910     Visit Number 3    Number of Visits 15    Date for OT Re-Evaluation 04/02/24    [facility]ization Type [facility]    Authorization - Visit Number 3    Progress Note Due on Visit 10    OT Start Time [phone]    OT Stop Time 0925    OT Time Calculation (min) 39 min    Activity Tolerance Patient tolerated treatment well    Behavior During Therapy WFL for tasks assessed/performed          Past Medical History:  Diagnosis Date   Gastritis    Migraines    Thyroid  disease    Past Surgical History:  Procedure Laterality Date   ABDOMINAL HYSTERECTOMY     ABDOMINAL SURGERY     CHOLECYSTECTOMY     There are no active problems to display for this patient.   ONSET DATE: 10/18/23- CTR surgery  REFERRING DIAG: left wrist pain, s/p CTR  THERAPY DIAG:  Pain in left hand  Pain in left wrist  Muscle weakness (generalized)  Localized edema  Rationale for Evaluation and Treatment: Rehabilitation  SUBJECTIVE:   SUBJECTIVE STATEMENT: Pt reports significant pain in left hand Pt accompanied by: self  PERTINENT HISTORY: s/p L carpal tunnel release 10/18/23, see above for medical history  PRECAUTIONS: None    WEIGHT BEARING RESTRICTIONS: No  PAIN:  Are you having pain? Yes: NPRS scale: 6/10 Pain location: wrist Pain description: aching, burning Aggravating factors: unknown Relieving factors: time,   FALLS: Has patient fallen in last 6 months? No  LIVING ENVIRONMENT: Lives with: lives with their spouse Lives in: House/apartment   PLOF: Independent  PATIENT GOALS: decrease pain in LUE, improve use  NEXT MD VISIT: unknown  OBJECTIVE:  Note: Objective measures were completed at Evaluation unless otherwise  noted.  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  FUNCTIONAL OUTCOME MEASURES: Quick Dash: 42.5% impairment  UPPER EXTREMITY ROM:     Active ROM Right eval Left eval  Shoulder flexion    Shoulder abduction    Shoulder adduction    Shoulder extension    Shoulder internal rotation    Shoulder external rotation    Elbow flexion    Elbow extension    Wrist flexion  60  Wrist extension  40  Wrist ulnar deviation    Wrist radial deviation    Wrist pronation    Wrist supination    (Blank rows = not tested)  Active ROM Right eval Left eval  Thumb MCP (0-60)  45  Thumb IP (0-80)  60  Thumb Radial abd/add (0-55)     Thumb Palmar abd/add (0-45)     Thumb Opposition to Small Finger     Index MCP (0-90)     Index PIP (0-100)     Index DIP (0-70)      Long MCP (0-90)      Long PIP (0-100)      Long DIP (0-70)      Ring MCP (0-90)      Ring PIP (0-100)      Ring DIP (0-70)      Little MCP (  0-90)      Little PIP (0-100)      Little DIP (0-70)      (Blank rows = not tested)  Pt demonstrates grossly 80% composite finger flexion due to pain, full extension for LUE.  HAND FUNCTION: Grip strength: Right: 50 lbs; Left: 10 lbs   SENSATION: Light touch: Impaired  palm and fingertips of LUE, pt with healed inccion at wrist. Pt reports pillar pain and she developed redness at ulnar side of palm following US  and exercises. Pt reports her palm changes color with exercise and use and she has intermittnat swelling.  EDEMA: intermittant swelling  COGNITION: Overall cognitive status: Within functional limits for tasks assessed   OBSERVATIONS: Pleasant female with significant pain in L hand and wrist   TREATMENT DATE: 7/29/25Modalities:Ultrasound:  Time: 6 mins Location: left volar wrist and palm 3.3 mhz, 0.8 w/cm 2, 20%, for pain and swelling, no adverse reactions  Soft tissue mobs,  massage to palm and incision followed by cupping for scar mobilization .  Reviewed exercises from [facility] protocol 10 reps each. Therapist recommends a compression glove. [facility]. Pt was provided with tensogrip for gentle compression in the mantime. Therpist recommends pt wears her wrist brace during heavier repetative activities as pt reports assisting with a move at work last week and her hand was really painful on Sunday. Paraffin x 8 mins to LUE for pain and stiffness, no adverse reactions. Pt's pain was improved at end of session.                                                                                                 01/14/24- Paraffin x 8 mins to LUE for pain and stiffness. Reviewed previously issued HEP from [facility] protocol 10 reps each, added prayer stretch. Soft tissue mobs, massage to palm and incision.                                                                                                               Modalities:Ultrasound:  Time: 8 mins Location: left volar wrist and palm 3.3 mhz, 0.8 w/cm 2, 20%, for pain and swelling, no adverse reactions    01/09/24 -eval, see pt education  Ultrasound:  Time: 8 mins Location: left volar wrist and palm 3.3 mhz, 0.8 w/cm 2, 20%, for pain and swelling, no adverse reactions   PATIENT EDUCATION: Education details:reviewed initial HEP from [facility] protocol, 10 reps each, Person educated: Patient Education method: demonstration, v.c Education comprehension: verbalized understanding, returned demonstration, and verbal cues required  HOME EXERCISE PROGRAM: 01/09/24- CT release exercises from [facility] protocol.   GOALS: Goals reviewed with patient? Yes  SHORT TERM GOALS: Target date: 02/09/24  I with inital HEP  Goal status: met, 01/14/24  2.  Pt will report LUE pain no greater than 4/10  for ADLs/ light use.  Goal status: ongoing, 5-10/10/10 01/21/24  3.  Pt will demonstrate ability to oppose all digits with thumb. Baseline: able to oppose only digits 2,3 Goal status:   4.  Pt will increase thumb MP flexion to 55* or greater for increased functional use. Baseline: 45 Goal status: ongoing  5.  Pt will increase thumb IP flexion to 70* or greater for increased functional use Baseline: 60 Goal status: INITIAL  6.  Pt will demonstrate at least 95% composite finger flexion for LUE. Baseline: 80% Goal status: INITIAL  LONG TERM GOALS: Target date: 04/02/24  I with updated HEP.  Goal status: INITIAL  2.  Pt will report  LUE pain no greater than 3/10 for ADLS/IADLs. Baseline: 6/10 Goal status: INITIAL  3.  Pt will improve Quick DASH score to 35% or better impairment Baseline: 42.5% Goal status: INITIAL  4.  Pt will demonstrate LUE grip strength of at least 25lbs for increased funcitonal use. Baseline: RUE 50 lbs, LUE10 lbs Goal status: INITIAL   ASSESSMENT:  CLINICAL IMPRESSION: Patient is progressing towards goals slowly. she is limited by pain. 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   IMPAIRMENTS: are limiting patient from ADLs, IADLs, rest and sleep, work, play, leisure, and social participation.   COMORBIDITIES: has no other co-morbidities that affects occupational performance. Patient will benefit from skilled OT to address above impairments and improve overall function.  MODIFICATION OR ASSISTANCE TO COMPLETE EVALUATION: No modification of tasks or assist  necessary to complete an evaluation.  OT OCCUPATIONAL PROFILE AND HISTORY: Detailed assessment: Review of records and additional review of physical, cognitive, psychosocial history related to current functional performance.  CLINICAL DECISION MAKING: LOW - limited treatment options, no task modification necessary  REHAB POTENTIAL: Good  EVALUATION COMPLEXITY: Low      PLAN:  OT FREQUENCY: 15 visits  OT DURATION: 12 weeks  PLANNED INTERVENTIONS: 97168 OT Re-evaluation, 97535 self care/ADL training, 02889 therapeutic exercise, 97530 therapeutic activity, 97112 neuromuscular re-education, 97140 manual therapy, 97035 ultrasound, 97018 paraffin, 02989 moist heat, 97010 cryotherapy, 97014 electrical stimulation unattended, scar mobilization, passive range of motion, energy conservation, coping strategies training, patient/family education, and DME and/or AE instructions  RECOMMENDED OTHER SERVICES: n/a  CONSULTED AND AGREED WITH PLAN OF CARE: Patient  PLAN FOR NEXT SESSION: review HEP,  paraffin end of session,  putty when pain is better   Rudie Rikard, OT 01/21/2024, 9:14 AM

## 2024-01-22 NOTE — Therapy (Incomplete)
 OUTPATIENT OCCUPATIONAL THERAPY ORTHO treatment  Patient Name: [name] MRN: [id number] DOB:12-15-1970, 53 y.o., female Today's Date: 01/23/2024  PCP: Dr. Catalina REFERRING PROVIDER: Dr. Dorsey Combes  END OF SESSION:  OT End of Session - 01/23/24 0824     Visit Number 4    Number of Visits 15    Authorization Type [facility]    Authorization - Visit Number 4    Progress Note Due on Visit 10    OT Start Time 0802    OT Stop Time 0845    OT Time Calculation (min) 43 min           Past Medical History:  Diagnosis Date   Gastritis    Migraines    Thyroid  disease    Past Surgical History:  Procedure Laterality Date   ABDOMINAL HYSTERECTOMY     ABDOMINAL SURGERY     CHOLECYSTECTOMY     There are no active problems to display for this patient.   ONSET DATE: 10/18/23- CTR surgery  REFERRING DIAG: left wrist pain, s/p CTR  THERAPY DIAG:  Pain in left hand  Pain in left wrist  Muscle weakness (generalized)  Localized edema  Rationale for Evaluation and Treatment: Rehabilitation  SUBJECTIVE:   SUBJECTIVE STATEMENT: Pt reports she continues to have periods of increased pain Pt accompanied by: self  PERTINENT HISTORY: s/p L carpal tunnel release 10/18/23, see above for medical history  PRECAUTIONS: None    WEIGHT BEARING RESTRICTIONS: No  PAIN:  Are you having pain? Yes: NPRS scale: 4/10 on arrival, 7/10 at worst over last several days, no pain end of session Pain location: wrist Pain description: aching, burning Aggravating factors: unknown Relieving factors: time,   FALLS: Has patient fallen in last 6 months? No  LIVING ENVIRONMENT: Lives with: lives with their spouse Lives in: House/apartment   PLOF: Independent  PATIENT GOALS: decrease pain in LUE, improve use  NEXT MD VISIT: unknown  OBJECTIVE:  Note: Objective measures were completed at Evaluation unless otherwise noted.  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  FUNCTIONAL OUTCOME MEASURES: Quick Dash: 42.5% impairment  UPPER EXTREMITY ROM:     Active ROM Right eval Left eval  Shoulder flexion    Shoulder abduction    Shoulder adduction    Shoulder extension    Shoulder internal rotation    Shoulder external rotation    Elbow flexion    Elbow extension    Wrist flexion  60  Wrist extension  40  Wrist ulnar deviation    Wrist radial deviation    Wrist pronation    Wrist supination    (Blank rows = not tested)  Active ROM Right eval Left eval  Thumb MCP (0-60)  45  Thumb IP (0-80)  60  Thumb Radial abd/add (0-55)     Thumb Palmar abd/add (0-45)     Thumb Opposition to Small Finger     Index MCP (0-90)     Index PIP (0-100)     Index DIP (0-70)      Long MCP (0-90)      Long PIP (0-100)      Long DIP (0-70)      Ring MCP (0-90)      Ring PIP (0-100)      Ring DIP (0-70)      Little MCP (0-90)      Little PIP (0-100)  Little DIP (0-70)      (Blank rows = not tested)  Pt demonstrates grossly 80% composite finger flexion due to pain, full extension for LUE.  HAND FUNCTION: Grip strength: Right: 50 lbs; Left: 10 lbs   SENSATION: Light touch: Impaired  palm and fingertips of LUE, pt with healed inccion at wrist. Pt reports pillar pain and she developed redness at ulnar side of palm following US  and exercises. Pt reports her palm changes color with exercise and use and she has intermittnat swelling.  EDEMA: intermittant swelling  COGNITION: Overall cognitive status: Within functional limits for tasks assessed   OBSERVATIONS: Pleasant female with significant pain in L hand and wrist   TREATMENT DATE: 7/31/15Modalities:Ultrasound:  Time: 6 mins Location: left volar wrist and palm 3.3 mhz, 0.8 w/cm 2, 20%, for pain and swelling, no adverse reactions  Soft tissue mobs, massage to palm,and incision as well as hand and forearm followed  by cupping for scar mobilization .  Reviewed exercises 1-6 from [place] protocol( pages 17 and 18)- 10 reps each. Added passive thumb MP flexion, prayer stretch, composite finger MP flexion and finger abduction/ adduction, 10 reps each min v.c Therapist recommends pt keeps a journal of when her pain increases and what she has been doing. Therapist recommends a rest break every 30 mins. Paraffin x 7 mins to LUE for pain and stiffness, no adverse reactions. Pt's pain was improved at end of session to 0/10.                                            7/29/25Modalities:Ultrasound:  Time: 6 mins Location: left volar wrist and palm 3.3 mhz, 0.8 w/cm 2, 20%, for pain and swelling, no adverse reactions  Soft tissue mobs, massage to palm and incision followed by cupping for scar mobilization .  Reviewed exercises from [place] protocol 10 reps each. Therapist recommends a compression glove. Pt plans [facility]. Pt was provided with tensogrip for gentle compression in the mantime. Therpist recommends pt wears her wrist brace during heavier repetative activities as pt reports assisting with a move at work last week and her hand was really painful on Sunday. Paraffin x 8 mins to LUE for pain and stiffness, no adverse reactions. Pt's pain was improved at end of session.                                                                                                 01/14/24- Paraffin x 8 mins to LUE for pain and stiffness. Reviewed previously issued HEP from [facility] protocol 10 reps each, added prayer stretch. Soft tissue mobs, massage to palm and incision.  Modalities:Ultrasound:  Time: 8 mins Location: left volar wrist and palm 3.3 mhz, 0.8 w/cm 2, 20%, for pain and swelling, no adverse reactions    01/09/24 -eval, see pt education                                                                                                                        Ultrasound:  Time: 8 mins Location: left volar wrist and palm 3.3 mhz, 0.8 w/cm 2, 20%, for pain and swelling, no adverse reactions   PATIENT EDUCATION: Education details:reviewed initial HEP from [facility] protocol, 10 reps each, Person educated: Patient Education method: demonstration, v.c Education comprehension: verbalized understanding, returned demonstration, and verbal cues required  HOME EXERCISE PROGRAM: 01/09/24- CT release exercises from [place] protocol.   GOALS: Goals reviewed with patient? Yes  SHORT TERM GOALS: Target date: 02/09/24  I with inital HEP  Goal status: met, 01/14/24  2.  Pt will report LUE pain no greater than 4/10 for ADLs/ light use.  Goal status: ongoing, 4-7/10 01/21/24  3.  Pt will demonstrate ability to oppose all digits with thumb. Baseline: able to oppose only digits 2,3 Goal status: met 01/23/24  4.  Pt will increase thumb MP flexion to 55* or greater for increased functional use. Baseline: 45 Goal status: ongoing, 01/23/24  5.  Pt will increase thumb IP flexion to 70* or greater for increased functional use Baseline: 60 Goal status: ongoing 01/23/24  6.  Pt will demonstrate at least 95% composite finger flexion for LUE. Baseline: 80% Goal status: ongoing 90-95% 01/23/24  LONG TERM GOALS: Target date: 04/02/24  I with updated HEP.  Goal status: INITIAL  2.  Pt will report  LUE pain no greater than 3/10 for ADLS/IADLs. Baseline: 6/10 Goal status: INITIAL  3.  Pt will improve Quick DASH score to 35% or better impairment Baseline: 42.5% Goal status: INITIAL  4.  Pt will demonstrate LUE grip strength of at least 25lbs for increased funcitonal use. Baseline: RUE 50 lbs, LUE10 lbs Goal status: INITIAL   ASSESSMENT:  CLINICAL IMPRESSION: Patient ireports her pain is better today however she has periods of increased pain. Therapist has recommended that pt takes rest  breaks every 30 mins and performs gentel stretches/ tenson gliding to see if pain is aleviated. Pt demonstrates improving ROM.   PERFORMANCE DEFICITS: in functional skills including ADLs, IADLs, coordination, dexterity, sensation, edema, ROM, strength, pain, flexibility, Fine motor control, Gross motor control, endurance, and UE functional use,  and psychosocial skills including coping strategies, environmental adaptation, habits, interpersonal interactions, and routines and behaviors.   IMPAIRMENTS: are limiting patient from ADLs, IADLs, rest and sleep, work, play, leisure, and social participation.   COMORBIDITIES: has no other co-morbidities that affects occupational performance. Patient will benefit from skilled OT to address above impairments and improve overall function.  MODIFICATION OR ASSISTANCE TO COMPLETE EVALUATION: No modification of tasks or assist necessary to complete an evaluation.  OT OCCUPATIONAL PROFILE AND HISTORY: Detailed assessment: Review of  records and additional review of physical, cognitive, psychosocial history related to current functional performance.  CLINICAL DECISION MAKING: LOW - limited treatment options, no task modification necessary  REHAB POTENTIAL: Good  EVALUATION COMPLEXITY: Low      PLAN:  OT FREQUENCY: 15 visits  OT DURATION: 12 weeks  PLANNED INTERVENTIONS: 97168 OT Re-evaluation, 97535 self care/ADL training, 02889 therapeutic exercise, 97530 therapeutic activity, 97112 neuromuscular re-education, 97140 manual therapy, 97035 ultrasound, 97018 paraffin, 02989 moist heat, 97010 cryotherapy, 97014 electrical stimulation unattended, scar mobilization, passive range of motion, energy conservation, coping strategies training, patient/family education, and DME and/or AE instructions  RECOMMENDED OTHER SERVICES: n/a  CONSULTED AND AGREED WITH PLAN OF CARE: Patient  PLAN FOR NEXT SESSION:  take ROM, review updates to HEP,  paraffin end of  session,  putty when pain is better   Lamaria Hildebrandt, OT 01/23/2024, 8:29 AM

## 2024-01-23 ENCOUNTER — Ambulatory Visit: Admitting: Occupational Therapy

## 2024-01-23 DIAGNOSIS — M6281 Muscle weakness (generalized): Secondary | ICD-10-CM

## 2024-01-23 DIAGNOSIS — M79642 Pain in left hand: Secondary | ICD-10-CM

## 2024-01-23 DIAGNOSIS — M25532 Pain in left wrist: Secondary | ICD-10-CM

## 2024-01-23 DIAGNOSIS — R600 Localized edema: Secondary | ICD-10-CM

## 2024-01-23 NOTE — Patient Instructions (Addendum)
 Task a rest break every 30 mins if possible when typing Do tendon glides and perfrom the exercises below 10 reps      MP Flexion (Passive)    Copyright  VHI. All rights reserved.   Finger Abduction / Adduction    Spread fingers of left hand. Then bring together. Keep fingers straight. Repeat sequence __10__ times per session. Do __3__ sessions per day Hand Variation: Palm up   Copyright  VHI. All rights reserved.   MP Flexion (Active)    With back of hand on table, bend large knuckles as far as they will go, keeping small joints straight.  Repeat __10__ times. Do _3___ sessions per day.     Use other hand to bend base joint of thumb. Hold __5__ seconds. Repeat __10__ times. Do ___1-2_ sessions per day. Activity: Tuck thumb under all fingers.*    Copyright  VHI. All rights reserved.    Consider an edema or compression glove consder bicycle gloves for driving or padded steering wheel cover

## 2024-01-28 ENCOUNTER — Ambulatory Visit: Attending: Orthopedic Surgery | Admitting: Occupational Therapy

## 2024-01-28 DIAGNOSIS — M6281 Muscle weakness (generalized): Secondary | ICD-10-CM | POA: Diagnosis present

## 2024-01-28 DIAGNOSIS — M79642 Pain in left hand: Secondary | ICD-10-CM | POA: Diagnosis present

## 2024-01-28 DIAGNOSIS — R600 Localized edema: Secondary | ICD-10-CM | POA: Diagnosis present

## 2024-01-28 DIAGNOSIS — M25532 Pain in left wrist: Secondary | ICD-10-CM | POA: Insufficient documentation

## 2024-01-28 NOTE — Therapy (Addendum)
 OUTPATIENT OCCUPATIONAL THERAPY ORTHO treatment  Patient Name: [name] MRN: [id number] DOB:[birth year], 53 y.o., female Today's Date: 01/28/2024  PCP: Dr. Catalina REFERRING PROVIDER: Dr. Dorsey Combes  END OF SESSION:  OT End of Session - 01/28/24 0826     Visit Number 5    Number of Visits 15    Date for OT Re-Evaluation 04/02/24    Authorization Type [facility]    Authorization - Visit Number 5    Authorization - Number of Visits 15    Progress Note Due on Visit 10    OT Start Time 0803    OT Stop Time 0845    OT Time Calculation (min) 42 min            Past Medical History:  Diagnosis Date   Gastritis    Migraines    Thyroid  disease    Past Surgical History:  Procedure Laterality Date   ABDOMINAL HYSTERECTOMY     ABDOMINAL SURGERY     CHOLECYSTECTOMY     There are no active problems to display for this patient.   ONSET DATE: 10/18/23- CTR surgery  REFERRING DIAG: left wrist pain, s/p CTR  THERAPY DIAG:  Pain in left hand  Pain in left wrist  Muscle weakness (generalized)  Localized edema  Rationale for Evaluation and Treatment: Rehabilitation  SUBJECTIVE:   SUBJECTIVE STATEMENT: Pt reports migraine today Pt accompanied by: self  PERTINENT HISTORY: s/p L carpal tunnel release 10/18/23, see above for medical history  PRECAUTIONS: None    WEIGHT BEARING RESTRICTIONS: No  PAIN:  Are you having pain? Yes: NPRS scale: 5/10,  no pain end of session Pain location: wrist Pain description: aching, burning Aggravating factors: unknown Relieving factors: time,   FALLS: Has patient fallen in last 6 months? No  LIVING ENVIRONMENT: Lives with: lives with their spouse Lives in: House/apartment   PLOF: Independent  PATIENT GOALS: decrease pain in LUE, improve use  NEXT MD VISIT: unknown  OBJECTIVE:  Note: Objective measures were completed at Evaluation unless otherwise noted.  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  FUNCTIONAL OUTCOME MEASURES: Quick Dash: 42.5% impairment  UPPER EXTREMITY ROM:     Active ROM Right eval Left eval  Shoulder flexion    Shoulder abduction    Shoulder adduction    Shoulder extension    Shoulder internal rotation    Shoulder external rotation    Elbow flexion    Elbow extension    Wrist flexion  60  Wrist extension  40  Wrist ulnar deviation    Wrist radial deviation    Wrist pronation    Wrist supination    (Blank rows = not tested)  Active ROM Right eval Left eval  Thumb MCP (0-60)  45  Thumb IP (0-80)  60  Thumb Radial abd/add (0-55)     Thumb Palmar abd/add (0-45)     Thumb Opposition to Small Finger     Index MCP (0-90)     Index PIP (0-100)     Index DIP (0-70)      Long MCP (0-90)      Long PIP (0-100)      Long DIP (0-70)      Ring MCP (0-90)      Ring PIP (0-100)      Ring DIP (0-70)      Little MCP (0-90)  Little PIP (0-100)      Little DIP (0-70)      (Blank rows = not tested)  Pt demonstrates grossly 80% composite finger flexion due to pain, full extension for LUE.  HAND FUNCTION: Grip strength: Right: 50 lbs; Left: 10 lbs   SENSATION: Light touch: Impaired  palm and fingertips of LUE, pt with healed inccion at wrist. Pt reports pillar pain and she developed redness at ulnar side of palm following US  and exercises. Pt reports her palm changes color with exercise and use and she has intermittnat swelling.  EDEMA: intermittant swelling  COGNITION: Overall cognitive status: Within functional limits for tasks assessed   OBSERVATIONS: Pleasant female with significant pain in L hand and wrist   TREATMENT DATE: 01/28/24 Modalities:Ultrasound:  Time: 8 mins Location: left volar wrist and palm 3.3 mhz, 0.8 w/cm 2, 20%, for pain and swelling, no adverse reactions Soft tissue mobs, massage to palm,and incision as well as hand and forearm followed  by cupping for scar mobilization .  Reviewed exercises 1-6 from [place] protocol( pages 17 and 18)- 10 reps each. Prayer stretch x 10 reps min v.c  Yellow putty exercises for sustained grip, tip pinch and lateral pinch, 10 reps each, min v.c and demonstration. Therapist did not issue yet as  HEP as pt had increased pain with pinching. Paraffin x 8 mins to LUE for pain and stiffness, no adverse reactions. Pt's pain was improved at end of session to 0/10. Therapist discussed with pt wearing her wrist brace during heavier and repetitive activities and importance of taking a rest break every hour and stretching at work.   01/22/14 Modalities:Ultrasound:  Time: Location: left volar wrist and palm 3.3 mhz, 0.8 w/cm 2, 20%, for pain and swelling, no adverse reactions  Soft tissue mobs, massage to palm,and incision as well as hand and forearm followed by cupping for scar mobilization .  Reviewed exercises 1-6 from [facility] protocol( pages 17 and 18)- 10 reps each. Added passive thumb MP flexion, prayer stretch, composite finger MP flexion and finger abduction/ adduction, 10 reps each min v.c Therapist recommends pt keeps a journal of when her pain increases and what she has been doing. Therapist recommends a rest break every 30 mins. Paraffin x 7 mins to LUE for pain and stiffness, no adverse reactions. Pt's pain was improved at end of session to 0/10.                                            7/29/25Modalities:Ultrasound:  Time: 6 mins Location: left volar wrist and palm 3.3 mhz, 0.8 w/cm 2, 20%, for pain and swelling, no adverse reactions  Soft tissue mobs, massage to palm and incision followed by cupping for scar mobilization .  Reviewed exercises from [facility] protocol 10 reps each. Therapist recommends a compression glove. [facility]. Pt was provided with tensogrip for gentle compression in the mantime. Therpist recommends pt wears her wrist brace during heavier  repetative activities as pt reports assisting with a move at work last week and her hand was really painful on Sunday. Paraffin x 8 mins to LUE for pain and stiffness, no adverse reactions. Pt's pain was improved at end of session.  01/14/24- Paraffin x 8 mins to LUE for pain and stiffness. Reviewed previously issued HEP from [facility] protocol 10 reps each, added prayer stretch. Soft tissue mobs, massage to palm and incision.                                                                                                               Modalities:Ultrasound:  Time: 8 mins Location: left volar wrist and palm 3.3 mhz, 0.8 w/cm 2, 20%, for pain and swelling, no adverse reactions    01/09/24 -eval, see pt education                                                                                                                       Ultrasound:  Time: 8 mins Location: left volar wrist and palm 3.3 mhz, 0.8 w/cm 2, 20%, for pain and swelling, no adverse reactions   PATIENT EDUCATION: Education details:reviewed initial HEP from [facility] protocol, 10 reps each, recommended rest breaks and stretching every -hour, recommendation that pt wears her pre-fab wrist brace during repetative and heavier activities to minimize pain. Person educated: Patient Education method: demonstration, v.c Education comprehension: verbalized understanding, returned demonstration, and verbal cues required  HOME EXERCISE PROGRAM: 01/09/24- CT release exercises from [facility] protocol.   GOALS: Goals reviewed with patient? Yes  SHORT TERM GOALS: Target date: 02/09/24  I with inital HEP  Goal status: met, 01/14/24  2.  Pt will report LUE pain no greater than 4/10 for ADLs/ light use.  Goal status: ongoing, 5/10, 01/28/24  3.  Pt will demonstrate ability to oppose all digits with thumb. Baseline: able to oppose only  digits 2,3 Goal status: met 01/23/24  4.  Pt will increase thumb MP flexion to 55* or greater for increased functional use. Baseline: 458/5/25  5.  Pt will increase thumb IP flexion to 70* or greater for increased functional use Baseline: 60 Goal status: ongoing  01/27/24  6.  Pt will demonstrate at least 95% composite finger flexion for LUE. Baseline: 80% Goal status: ongoing 90-95%  01/28/24  LONG TERM GOALS: Target date: 04/02/24  I with updated HEP.  Goal status: INITIAL  2.  Pt will report  LUE pain no greater than 3/10 for ADLS/IADLs. Baseline: 6/10 Goal status: INITIAL  3.  Pt will improve Quick DASH score to 35% or better impairment Baseline: 42.5% Goal status: INITIAL  4.  Pt will demonstrate LUE grip strength of at least 25lbs for increased funcitonal use. Baseline: RUE 50 lbs, LUE10 lbs Goal status: INITIAL  ASSESSMENT:  CLINICAL IMPRESSION: Patient reports her pain is improved overall however she continues to have episodes of increased pain at times. Therapist has recommended more frequent rest breaks at work and wearing her wrist brace with heavier activities. PERFORMANCE DEFICITS: in functional skills including ADLs, IADLs, coordination, dexterity, sensation, edema, ROM, strength, pain, flexibility, Fine motor control, Gross motor control, endurance, and UE functional use,  and psychosocial skills including coping strategies, environmental adaptation, habits, interpersonal interactions, and routines and behaviors.   IMPAIRMENTS: are limiting patient from ADLs, IADLs, rest and sleep, work, play, leisure, and social participation.   COMORBIDITIES: has no other co-morbidities that affects occupational performance. Patient will benefit from skilled OT to address above impairments and improve overall function.  MODIFICATION OR ASSISTANCE TO COMPLETE EVALUATION: No modification of tasks or assist necessary to complete an evaluation.  OT OCCUPATIONAL PROFILE AND  HISTORY: Detailed assessment: Review of records and additional review of physical, cognitive, psychosocial history related to current functional performance.  CLINICAL DECISION MAKING: LOW - limited treatment options, no task modification necessary  REHAB POTENTIAL: Good  EVALUATION COMPLEXITY: Low      PLAN:  OT FREQUENCY: 15 visits  OT DURATION: 12 weeks  PLANNED INTERVENTIONS: 97168 OT Re-evaluation, 97535 self care/ADL training, 02889 therapeutic exercise, 97530 therapeutic activity, 97112 neuromuscular re-education, 97140 manual therapy, 97035 ultrasound, 97018 paraffin, 02989 moist heat, 97010 cryotherapy, 97014 electrical stimulation unattended, scar mobilization, passive range of motion, energy conservation, coping strategies training, patient/family education, and DME and/or AE instructions  RECOMMENDED OTHER SERVICES: n/a  CONSULTED AND AGREED WITH PLAN OF CARE: Patient  PLAN FOR NEXT SESSION:  take thumb ROM, yellow putty exercises, issue if pt. does not have increased pain with performance, US , paraffin.   Chianti Goh, OT 01/28/2024, 9:03 AM

## 2024-01-28 NOTE — Patient Instructions (Addendum)
 Grip Strengthening (Resistive Putty)    Squeeze putty using thumb and all fingers. Repeat __10__ times. Do __1-2__ sessions per day. Finger / Thumb Activities: Extension    Roll putty into rope shape using all fingers held straight. Hitchhike with thumb up and out.  Copyright  VHI. All rights reserved.   Copyright  VHI. All rights reserved.  Pinch: Palmar    Pinch putty with right thumb and each fingertip in turn. Repeat __10__ times. Do __1-2__ sessions per day. Activity: Peel fruit such as lemons or oranges.* Peel stickers off surfaces.  Copyright  VHI. All rights reserved.    Pinch: Lateral    Squeeze putty between right thumb and side of each finger in turn. Make small balls and squash into coin shapes. Repeat ____ times. Do ____ sessions per day. Activity: Hold dish of food.* Turn key in lock. Deal cards.  Copyright  VHI. All rights reserved.

## 2024-01-31 ENCOUNTER — Ambulatory Visit: Admitting: Occupational Therapy

## 2024-02-11 ENCOUNTER — Ambulatory Visit: Admitting: Occupational Therapy

## 2024-02-11 NOTE — Therapy (Deleted)
 OUTPATIENT OCCUPATIONAL THERAPY ORTHO treatment  Patient Name: [name] MRN: [id number] DOB:June 18, 1971, 53 y.o., female Today's Date: 02/11/2024  PCP: Dr. Catalina REFERRING PROVIDER: Dr. Dorsey Combes  END OF SESSION:      Past Medical History:  Diagnosis Date   Gastritis    Migraines    Thyroid  disease    Past Surgical History:  Procedure Laterality Date   ABDOMINAL HYSTERECTOMY     ABDOMINAL SURGERY     CHOLECYSTECTOMY     There are no active problems to display for this patient.   ONSET DATE: 10/18/23- CTR surgery  REFERRING DIAG: left wrist pain, s/p CTR  THERAPY DIAG:  No diagnosis found.  Rationale for Evaluation and Treatment: Rehabilitation  SUBJECTIVE:   SUBJECTIVE STATEMENT: Pt reports migraine today Pt accompanied by: self  PERTINENT HISTORY: s/p L carpal tunnel release 10/18/23, see above for medical history  PRECAUTIONS: None    WEIGHT BEARING RESTRICTIONS: No  PAIN:  Are you having pain? Yes: NPRS scale: 5/10,  no pain end of session Pain location: wrist Pain description: aching, burning Aggravating factors: unknown Relieving factors: time,   FALLS: Has patient fallen in last 6 months? No  LIVING ENVIRONMENT: Lives with: lives with their spouse Lives in: House/apartment   PLOF: Independent  PATIENT GOALS: decrease pain in LUE, improve use  NEXT MD VISIT: unknown  OBJECTIVE:  Note: Objective measures were completed at Evaluation unless otherwise noted.  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  FUNCTIONAL OUTCOME MEASURES: Quick Dash: 42.5% impairment  UPPER EXTREMITY ROM:     Active ROM Right eval Left eval  Shoulder flexion    Shoulder abduction    Shoulder adduction    Shoulder extension    Shoulder internal rotation    Shoulder external rotation    Elbow flexion    Elbow extension    Wrist flexion  60   Wrist extension  40  Wrist ulnar deviation    Wrist radial deviation    Wrist pronation    Wrist supination    (Blank rows = not tested)  Active ROM Right eval Left eval  Thumb MCP (0-60)  45  Thumb IP (0-80)  60  Thumb Radial abd/add (0-55)     Thumb Palmar abd/add (0-45)     Thumb Opposition to Small Finger     Index MCP (0-90)     Index PIP (0-100)     Index DIP (0-70)      Long MCP (0-90)      Long PIP (0-100)      Long DIP (0-70)      Ring MCP (0-90)      Ring PIP (0-100)      Ring DIP (0-70)      Little MCP (0-90)      Little PIP (0-100)      Little DIP (0-70)      (Blank rows = not tested)  Pt demonstrates grossly 80% composite finger flexion due to pain, full extension for LUE.  HAND FUNCTION: Grip strength: Right: 50 lbs; Left: 10 lbs   SENSATION: Light touch: Impaired  palm and fingertips of LUE, pt with healed inccion at wrist. Pt reports pillar pain and she developed redness at ulnar side of palm following US  and exercises. Pt reports her palm changes color with exercise and use and she has intermittnat swelling.  EDEMA: intermittant swelling  COGNITION:  Overall cognitive status: Within functional limits for tasks assessed   OBSERVATIONS: Pleasant female with significant pain in L hand and wrist   TREATMENT DATE: 01/28/24 Modalities:Ultrasound:  Time: 8 mins Location: left volar wrist and palm 3.3 mhz, 0.8 w/cm 2, 20%, for pain and swelling, no adverse reactions Soft tissue mobs, massage to palm,and incision as well as hand and forearm followed by cupping for scar mobilization .  Reviewed exercises 1-6 from [place] protocol( pages 17 and 18)- 10 reps each. Prayer stretch x 10 reps min v.c  Yellow putty exercises for sustained grip, tip pinch and lateral pinch, 10 reps each, min v.c and demonstration. Therapist did not issue yet as  HEP as pt had increased pain with pinching. Paraffin x 8 mins to LUE for pain and stiffness, no adverse reactions. Pt's  pain was improved at end of session to 0/10. Therapist discussed with pt wearing her wrist brace during heavier and repetitive activities and importance of taking a rest break every hour and stretching at work.   01/22/14 Modalities:Ultrasound:  Time: Location: left volar wrist and palm 3.3 mhz, 0.8 w/cm 2, 20%, for pain and swelling, no adverse reactions  Soft tissue mobs, massage to palm,and incision as well as hand and forearm followed by cupping for scar mobilization .  Reviewed exercises 1-6 from [place] protocol( pages 17 and 18)- 10 reps each. Added passive thumb MP flexion, prayer stretch, composite finger MP flexion and finger abduction/ adduction, 10 reps each min v.c Therapist recommends pt keeps a journal of when her pain increases and what she has been doing. Therapist recommends a rest break every 30 mins. Paraffin x 7 mins to LUE for pain and stiffness, no adverse reactions. Pt's pain was improved at end of session to 0/10.                                            7/29/25Modalities:Ultrasound:  Time: 6 mins Location: left volar wrist and palm 3.3 mhz, 0.8 w/cm 2, 20%, for pain and swelling, no adverse reactions  Soft tissue mobs, massage to palm and incision followed by cupping for scar mobilization .  Reviewed exercises from [place] protocol 10 reps each. Therapist recommends a compression glove. [facility]. Pt was provided with tensogrip for gentle compression in the mantime. Therpist recommends pt wears her wrist brace during heavier repetative activities as pt reports assisting with a move at work last week and her hand was really painful on Sunday. Paraffin x 8 mins to LUE for pain and stiffness, no adverse reactions. Pt's pain was improved at end of session.                                                                                                 01/14/24- Paraffin x 8 mins to LUE for pain and stiffness. Reviewed previously issued HEP  from [place] protocol 10 reps each, added prayer stretch. Soft tissue mobs, massage to palm and incision.  Modalities:Ultrasound:  Time: 8 mins Location: left volar wrist and palm 3.3 mhz, 0.8 w/cm 2, 20%, for pain and swelling, no adverse reactions    01/09/24 -eval, see pt education                                                                                                                       Ultrasound:  Time: 8 mins Location: left volar wrist and palm 3.3 mhz, 0.8 w/cm 2, 20%, for pain and swelling, no adverse reactions   PATIENT EDUCATION: Education details:reviewed initial HEP from [facility] protocol, 10 reps each, recommended rest breaks and stretching every -hour, recommendation that pt wears her pre-fab wrist brace during repetative and heavier activities to minimize pain. Person educated: Patient Education method: demonstration, v.c Education comprehension: verbalized understanding, returned demonstration, and verbal cues required  HOME EXERCISE PROGRAM: 01/09/24- CT release exercises from [place] protocol.   GOALS: Goals reviewed with patient? Yes  SHORT TERM GOALS: Target date: 02/09/24  I with inital HEP  Goal status: met, 01/14/24  2.  Pt will report LUE pain no greater than 4/10 for ADLs/ light use.  Goal status: ongoing, 5/10, 01/28/24  3.  Pt will demonstrate ability to oppose all digits with thumb. Baseline: able to oppose only digits 2,3 Goal status: met 01/23/24  4.  Pt will increase thumb MP flexion to 55* or greater for increased functional use. Baseline: 458/5/25  5.  Pt will increase thumb IP flexion to 70* or greater for increased functional use Baseline: 60 Goal status: ongoing  01/27/24  6.  Pt will demonstrate at least 95% composite finger flexion for LUE. Baseline: 80% Goal status: ongoing 90-95%  01/28/24  LONG TERM GOALS:  Target date: 04/02/24  I with updated HEP.  Goal status: INITIAL  2.  Pt will report  LUE pain no greater than 3/10 for ADLS/IADLs. Baseline: 6/10 Goal status: INITIAL  3.  Pt will improve Quick DASH score to 35% or better impairment Baseline: 42.5% Goal status: INITIAL  4.  Pt will demonstrate LUE grip strength of at least 25lbs for increased funcitonal use. Baseline: RUE 50 lbs, LUE10 lbs Goal status: INITIAL   ASSESSMENT:  CLINICAL IMPRESSION: Patient reports her pain is improved overall however she continues to have episodes of increased pain at times. Therapist has recommended more frequent rest breaks at work and wearing her wrist brace with heavier activities. PERFORMANCE DEFICITS: in functional skills including ADLs, IADLs, coordination, dexterity, sensation, edema, ROM, strength, pain, flexibility, Fine motor control, Gross motor control, endurance, and UE functional use,  and psychosocial skills including coping strategies, environmental adaptation, habits, interpersonal interactions, and routines and behaviors.   IMPAIRMENTS: are limiting patient from ADLs, IADLs, rest and sleep, work, play, leisure, and social participation.   COMORBIDITIES: has no other co-morbidities that affects occupational performance. Patient will benefit from skilled OT to address above impairments and improve overall function.  MODIFICATION OR ASSISTANCE TO COMPLETE EVALUATION: No modification of tasks or assist necessary to  complete an evaluation.  OT OCCUPATIONAL PROFILE AND HISTORY: Detailed assessment: Review of records and additional review of physical, cognitive, psychosocial history related to current functional performance.  CLINICAL DECISION MAKING: LOW - limited treatment options, no task modification necessary  REHAB POTENTIAL: Good  EVALUATION COMPLEXITY: Low      PLAN:  OT FREQUENCY: 15 visits  OT DURATION: 12 weeks  PLANNED INTERVENTIONS: 97168 OT Re-evaluation, 97535  self care/ADL training, 02889 therapeutic exercise, 97530 therapeutic activity, 97112 neuromuscular re-education, 97140 manual therapy, 97035 ultrasound, 97018 paraffin, 02989 moist heat, 97010 cryotherapy, 97014 electrical stimulation unattended, scar mobilization, passive range of motion, energy conservation, coping strategies training, patient/family education, and DME and/or AE instructions  RECOMMENDED OTHER SERVICES: n/a  CONSULTED AND AGREED WITH PLAN OF CARE: Patient  PLAN FOR NEXT SESSION:  take thumb ROM, yellow putty exercises, issue if pt. does not have increased pain with performance, US , paraffin.   Freya Zobrist, OT 02/11/2024, 8:06 AM

## 2024-02-13 ENCOUNTER — Ambulatory Visit: Admitting: Occupational Therapy

## 2024-02-25 ENCOUNTER — Encounter: Payer: Self-pay | Admitting: Occupational Therapy

## 2024-02-25 ENCOUNTER — Ambulatory Visit: Payer: Self-pay | Attending: Internal Medicine | Admitting: Occupational Therapy

## 2024-02-25 DIAGNOSIS — M25532 Pain in left wrist: Secondary | ICD-10-CM | POA: Insufficient documentation

## 2024-02-25 DIAGNOSIS — M6281 Muscle weakness (generalized): Secondary | ICD-10-CM | POA: Diagnosis present

## 2024-02-25 DIAGNOSIS — R600 Localized edema: Secondary | ICD-10-CM | POA: Diagnosis present

## 2024-02-25 DIAGNOSIS — M79642 Pain in left hand: Secondary | ICD-10-CM | POA: Insufficient documentation

## 2024-02-25 NOTE — Therapy (Signed)
 OUTPATIENT OCCUPATIONAL THERAPY ORTHO treatment  Patient Name: [name] MRN: [id number] DOB:1971-06-12, 53 y.o., female Today's Date: 02/25/2024  PCP: Dr. Catalina REFERRING PROVIDER: Dr. Dorsey Combes  END OF SESSION:  OT End of Session - 02/25/24 1244     Visit Number 6    Number of Visits 15    Date for OT Re-Evaluation 04/02/24    Authorization Type [facility]    Authorization - Visit Number 6    Authorization - Number of Visits 15    Progress Note Due on Visit 10    OT Start Time 0932    OT Stop Time 0957   request to leave early for work   OT Time Calculation (min) 25 min             Past Medical History:  Diagnosis Date   Gastritis    Migraines    Thyroid  disease    Past Surgical History:  Procedure Laterality Date   ABDOMINAL HYSTERECTOMY     ABDOMINAL SURGERY     CHOLECYSTECTOMY     There are no active problems to display for this patient.   ONSET DATE: 10/18/23- CTR surgery  REFERRING DIAG: left wrist pain, s/p CTR  THERAPY DIAG:  Pain in left hand  Pain in left wrist  Muscle weakness (generalized)  Localized edema  Rationale for Evaluation and Treatment: Rehabilitation  SUBJECTIVE:   SUBJECTIVE STATEMENT: Pt reports migraine today Pt accompanied by: self  PERTINENT HISTORY: s/p L carpal tunnel release 10/18/23, see above for medical history  PRECAUTIONS: None    WEIGHT BEARING RESTRICTIONS: No  PAIN:  Are you having pain? Yes: NPRS scale: 5/10,  no pain end of session Pain location: wrist Pain description: aching, burning Aggravating factors: unknown Relieving factors: time,   FALLS: Has patient fallen in last 6 months? No  LIVING ENVIRONMENT: Lives with: lives with their spouse Lives in: House/apartment   PLOF: Independent  PATIENT GOALS: decrease pain in LUE, improve use  NEXT MD VISIT: unknown  OBJECTIVE:  Note: Objective measures were completed at Evaluation unless otherwise noted.  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  FUNCTIONAL OUTCOME MEASURES: Quick Dash: 42.5% impairment  UPPER EXTREMITY ROM:     Active ROM Right eval Left eval  Shoulder flexion    Shoulder abduction    Shoulder adduction    Shoulder extension    Shoulder internal rotation    Shoulder external rotation    Elbow flexion    Elbow extension    Wrist flexion  60  Wrist extension  40  Wrist ulnar deviation    Wrist radial deviation    Wrist pronation    Wrist supination    (Blank rows = not tested)  Active ROM Right eval Left eval  Thumb MCP (0-60)  45  Thumb IP (0-80)  60  Thumb Radial abd/add (0-55)     Thumb Palmar abd/add (0-45)     Thumb Opposition to Small Finger     Index MCP (0-90)     Index PIP (0-100)     Index DIP (0-70)      Long MCP (0-90)      Long PIP (0-100)      Long DIP (0-70)      Ring MCP (0-90)      Ring PIP (0-100)      Ring DIP (0-70)  Little MCP (0-90)      Little PIP (0-100)      Little DIP (0-70)      (Blank rows = not tested)  Pt demonstrates grossly 80% composite finger flexion due to pain, full extension for LUE.  HAND FUNCTION: Grip strength: Right: 50 lbs; Left: 10 lbs   SENSATION: Light touch: Impaired  palm and fingertips of LUE, pt with healed inccion at wrist. Pt reports pillar pain and she developed redness at ulnar side of palm following US  and exercises. Pt reports her palm changes color with exercise and use and she has intermittnat swelling.  EDEMA: intermittant swelling  COGNITION: Overall cognitive status: Within functional limits for tasks assessed   OBSERVATIONS: Pleasant female with significant pain in L hand and wrist   TREATMENT DATE: 02/25/24 Modalities:Ultrasound:  Time: 6 mins Location: left volar wrist and palm 3.3 mhz, 0.8 w/cm 2, 20%, for pain and swelling, no adverse reactions  Tendon gliding exercise, thumb flexion/  extensionTherapist checked progress towards short term goals. See below. Yellow theraputty HEP reviewed and handouts issued (see pt instructions last visit) Discussed imprortance of desensitization.  01/28/24 Modalities:Ultrasound:  Time: 8 mins Location: left volar wrist and palm 3.3 mhz, 0.8 w/cm 2, 20%, for pain and swelling, no adverse reactions Soft tissue mobs, massage to palm,and incision as well as hand and forearm followed by cupping for scar mobilization .  Reviewed exercises 1-6 from [place] protocol( pages 17 and 18)- 10 reps each. Prayer stretch x 10 reps min v.c  Yellow putty exercises for sustained grip, tip pinch and lateral pinch, 10 reps each, min v.c and demonstration. Therapist did not issue yet as  HEP as pt had increased pain with pinching. Paraffin x 8 mins to LUE for pain and stiffness, no adverse reactions. Pt's pain was improved at end of session to 0/10. Therapist discussed with pt wearing her wrist brace during heavier and repetitive activities and importance of taking a rest break every hour and stretching at work.   01/22/14 Modalities:Ultrasound:  Time: Location: left volar wrist and palm 3.3 mhz, 0.8 w/cm 2, 20%, for pain and swelling, no adverse reactions  Soft tissue mobs, massage to palm,and incision as well as hand and forearm followed by cupping for scar mobilization .  Reviewed exercises 1-6 from [place] protocol( pages 17 and 18)- 10 reps each. Added passive thumb MP flexion, prayer stretch, composite finger MP flexion and finger abduction/ adduction, 10 reps each min v.c Therapist recommends pt keeps a journal of when her pain increases and what she has been doing. Therapist recommends a rest break every 30 mins. Paraffin x 7 mins to LUE for pain and stiffness, no adverse reactions. Pt's pain was improved at end of session to 0/10.                                            7/29/25Modalities:Ultrasound:  Time: 6 mins Location: left volar  wrist and palm 3.3 mhz, 0.8 w/cm 2, 20%, for pain and swelling, no adverse reactions  Soft tissue mobs, massage to palm and incision followed by cupping for scar mobilization .  Reviewed exercises from [place] protocol 10 reps each. Therapist recommends a compression glove. [facility]. Pt was provided with tensogrip for gentle compression in the mantime. Therpist recommends pt wears her wrist brace during heavier repetative activities as  pt reports assisting with a move at work last week and her hand was really painful on Sunday. Paraffin x 8 mins to LUE for pain and stiffness, no adverse reactions. Pt's pain was improved at end of session.                                                                                                 01/14/24- Paraffin x 8 mins to LUE for pain and stiffness. Reviewed previously issued HEP from [facility] protocol 10 reps each, added prayer stretch. Soft tissue mobs, massage to palm and incision.                                                                                                               Modalities:Ultrasound:  Time: 8 mins Location: left volar wrist and palm 3.3 mhz, 0.8 w/cm 2, 20%, for pain and swelling, no adverse reactions    01/09/24 -eval, see pt education                                                                                                                       Ultrasound:  Time: 8 mins Location: left volar wrist and palm 3.3 mhz, 0.8 w/cm 2, 20%, for pain and swelling, no adverse reactions   PATIENT EDUCATION: Education details:yellow putty HEP Person educated: Patient Education method: demonstration, v.c, handout Education comprehension: verbalized understanding, returned demonstration, and verbal cues required  HOME EXERCISE PROGRAM: 01/09/24- CT release exercises from [place] protocol.  02/25/24- yellow putty  GOALS: Goals reviewed with patient? Yes  SHORT TERM GOALS: Target date: 02/09/24  I  with inital HEP  Goal status: met, 01/14/24  2.  Pt will report LUE pain no greater than 4/10 for ADLs/ light use.  Goal status: ongoing, 3-5/10 02/25/24  3.  Pt will demonstrate ability to oppose all digits with thumb. Baseline: able to oppose only digits 2,3 Goal status: met 01/23/24  4.  Pt will increase thumb MP flexion to 55* or greater for increased functional use. Baseline: 45 met, 60 02/25/24  5.  Pt will increase thumb IP flexion to 70* or greater for increased  functional use Baseline: 60 Goal status: ongoing 65 *02/25/24  6.  Pt will demonstrate at least 95% composite finger flexion for LUE. Baseline: 80% Goal status:  met, 02/25/24  LONG TERM GOALS: Target date: 04/02/24  I with updated HEP.  Goal status: INITIAL  2.  Pt will report  LUE pain no greater than 3/10 for ADLS/IADLs. Baseline: 6/10 Goal status: INITIAL  3.  Pt will improve Quick DASH score to 35% or better impairment Baseline: 42.5% Goal status: INITIAL  4.  Pt will demonstrate LUE grip strength of at least 25lbs for increased funcitonal use. Baseline: RUE 50 lbs, LUE10 lbs Goal status: INITIAL   ASSESSMENT:  CLINICAL IMPRESSION: Patient reports her pain is improved overall. She has missed several visits due to illness and a death in the family. pt demonstrates understanding of putty HEP. PERFORMANCE DEFICITS: in functional skills including ADLs, IADLs, coordination, dexterity, sensation, edema, ROM, strength, pain, flexibility, Fine motor control, Gross motor control, endurance, and UE functional use,  and psychosocial skills including coping strategies, environmental adaptation, habits, interpersonal interactions, and routines and behaviors.   IMPAIRMENTS: are limiting patient from ADLs, IADLs, rest and sleep, work, play, leisure, and social participation.   COMORBIDITIES: has no other co-morbidities that affects occupational performance. Patient will benefit from skilled OT to address above impairments  and improve overall function.  MODIFICATION OR ASSISTANCE TO COMPLETE EVALUATION: No modification of tasks or assist necessary to complete an evaluation.  OT OCCUPATIONAL PROFILE AND HISTORY: Detailed assessment: Review of records and additional review of physical, cognitive, psychosocial history related to current functional performance.  CLINICAL DECISION MAKING: LOW - limited treatment options, no task modification necessary  REHAB POTENTIAL: Good  EVALUATION COMPLEXITY: Low      PLAN:  OT FREQUENCY: 15 visits  OT DURATION: 12 weeks  PLANNED INTERVENTIONS: 97168 OT Re-evaluation, 97535 self care/ADL training, 02889 therapeutic exercise, 97530 therapeutic activity, 97112 neuromuscular re-education, 97140 manual therapy, 97035 ultrasound, 97018 paraffin, 02989 moist heat, 97010 cryotherapy, 97014 electrical stimulation unattended, scar mobilization, passive range of motion, energy conservation, coping strategies training, patient/family education, and DME and/or AE instructions  RECOMMENDED OTHER SERVICES: n/a  CONSULTED AND AGREED WITH PLAN OF CARE: Patient  PLAN FOR NEXT SESSION:  review yellow putty exercises,  US , paraffin.   Jammie Troup, OT 02/25/2024, 12:50 PM

## 2024-03-03 ENCOUNTER — Ambulatory Visit: Admitting: Occupational Therapy

## 2024-03-03 DIAGNOSIS — M79642 Pain in left hand: Secondary | ICD-10-CM

## 2024-03-03 DIAGNOSIS — M6281 Muscle weakness (generalized): Secondary | ICD-10-CM

## 2024-03-03 DIAGNOSIS — M25532 Pain in left wrist: Secondary | ICD-10-CM

## 2024-03-03 DIAGNOSIS — R600 Localized edema: Secondary | ICD-10-CM

## 2024-03-03 NOTE — Patient Instructions (Signed)
 Composite Flexion (Passive)    Use other hand to bend both joints of thumb at the same time. Hold __5__ seconds. Repeat __5_ times. Do ___1-2_ sessions per day.      IP Flexion (Active Blocked)    Brace thumb below tip joint. Bend joint as far as possible. Repeat _5-10___ times. Do __1-2__ sessions per day.  Copyright  [employer]. All rights reserved.   Copyright  [employer]. All rights reserved.

## 2024-03-03 NOTE — Therapy (Signed)
 OUTPATIENT OCCUPATIONAL THERAPY ORTHO treatment  Patient Name: [name] MRN: [id number] DOB:10-Jan-1971, 53 y.o., female Today's Date: 03/03/2024  PCP: Dr. Catalina REFERRING PROVIDER: Dr. Dorsey Combes  END OF SESSION:  OT End of Session - 03/03/24 0907     Visit Number 7    Number of Visits 15    Date for OT Re-Evaluation 04/02/24    Authorization Type [facility]    Authorization - Visit Number 7    Authorization - Number of Visits 15    OT Start Time 0849    OT Stop Time 0928    OT Time Calculation (min) 39 min    Activity Tolerance Patient tolerated treatment well    Behavior During Therapy WFL for tasks assessed/performed             Past Medical History:  Diagnosis Date   Gastritis    Migraines    Thyroid  disease    Past Surgical History:  Procedure Laterality Date   ABDOMINAL HYSTERECTOMY     ABDOMINAL SURGERY     CHOLECYSTECTOMY     There are no active problems to display for this patient.   ONSET DATE: 10/18/23- CTR surgery  REFERRING DIAG: left wrist pain, s/p CTR  THERAPY DIAG:  Pain in left hand  Pain in left wrist  Muscle weakness (generalized)  Localized edema  Rationale for Evaluation and Treatment: Rehabilitation  SUBJECTIVE:   SUBJECTIVE STATEMENT: Pt reports she has been exercising Pt accompanied by: self  PERTINENT HISTORY: s/p L carpal tunnel release 10/18/23, see above for medical history  PRECAUTIONS: None    WEIGHT BEARING RESTRICTIONS: No  PAIN:  Are you having pain? Yes: NPRS scale: 5/10,  no pain end of session Pain location: wrist Pain description: aching, burning Aggravating factors: unknown Relieving factors: time,   FALLS: Has patient fallen in last 6 months? No  LIVING ENVIRONMENT: Lives with: lives with their spouse Lives in: House/apartment   PLOF: Independent  PATIENT GOALS: decrease pain in LUE, improve use  NEXT MD VISIT: unknown  OBJECTIVE:  Note: Objective measures were completed at  Evaluation unless otherwise noted.  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  FUNCTIONAL OUTCOME MEASURES: Quick Dash: 42.5% impairment  UPPER EXTREMITY ROM:     Active ROM Right eval Left eval  Shoulder flexion    Shoulder abduction    Shoulder adduction    Shoulder extension    Shoulder internal rotation    Shoulder external rotation    Elbow flexion    Elbow extension    Wrist flexion  60  Wrist extension  40  Wrist ulnar deviation    Wrist radial deviation    Wrist pronation    Wrist supination    (Blank rows = not tested)  Active ROM Right eval Left eval  Thumb MCP (0-60)  45  Thumb IP (0-80)  60  Thumb Radial abd/add (0-55)     Thumb Palmar abd/add (0-45)     Thumb Opposition to Small Finger     Index MCP (0-90)     Index PIP (0-100)     Index DIP (0-70)      Long MCP (0-90)      Long PIP (0-100)      Long DIP (0-70)      Ring MCP (0-90)      Ring PIP (0-100)      Ring DIP (  0-70)      Little MCP (0-90)      Little PIP (0-100)      Little DIP (0-70)      (Blank rows = not tested)  Pt demonstrates grossly 80% composite finger flexion due to pain, full extension for LUE.  HAND FUNCTION: Grip strength: Right: 50 lbs; Left: 10 lbs   SENSATION: Light touch: Impaired  palm and fingertips of LUE, pt with healed inccion at wrist. Pt reports pillar pain and she developed redness at ulnar side of palm following US  and exercises. Pt reports her palm changes color with exercise and use and she has intermittnat swelling.  EDEMA: intermittant swelling  COGNITION: Overall cognitive status: Within functional limits for tasks assessed   OBSERVATIONS: Pleasant female with significant pain in L hand and wrist   TREATMENT DATE: 03/03/24-Modalities:Ultrasound:  Time: 8 mins Location: left volar wrist and palm 3.3 mhz, 0.8 w/cm 2, 20%, for pain and swelling, no adverse  reactions cupping to palm and incsion for scar mobilization. Tendon gliding exercise, thumb flexion/ extension 10 reps each Prayer position for wrist extension x 5 reps min v.c hold 10 secs  A/ROM thumb composite flexion, then P/ROM thumb composite flexion, finger thumb opposition Reviewed yellow putty exercises for sustained grip and tip pinch, min v.c Paraffin x 8 mins no adverse reactions. Pain was reduced end of session.     02/25/24 Modalities:Ultrasound:  Time: 6 mins Location: left volar wrist and palm 3.3 mhz, 0.8 w/cm 2, 20%, for pain and swelling, no adverse reactions  Tendon gliding exercise, thumb flexion/ extensionTherapist checked progress towards short term goals. See below. Yellow theraputty HEP reviewed and handouts issued (see pt instructions last visit) Discussed importance of desensitization.  01/28/24 Modalities:Ultrasound:  Time: 8 mins Location: left volar wrist and palm 3.3 mhz, 0.8 w/cm 2, 20%, for pain and swelling, no adverse reactions Soft tissue mobs, massage to palm,and incision as well as hand and forearm followed by cupping for scar mobilization .  Reviewed exercises 1-6 from [place] protocol( pages 17 and 18)- 10 reps each. Prayer stretch x 10 reps min v.c  Yellow putty exercises for sustained grip, tip pinch and lateral pinch, 10 reps each, min v.c and demonstration. Therapist did not issue yet as  HEP as pt had increased pain with pinching. Paraffin x 8 mins to LUE for pain and stiffness, no adverse reactions. Pt's pain was improved at end of session to 0/10. Therapist discussed with pt wearing her wrist brace during heavier and repetitive activities and importance of taking a rest break every hour and stretching at work.   01/22/14 Modalities:Ultrasound:  Time: Location: left volar wrist and palm 3.3 mhz, 0.8 w/cm 2, 20%, for pain and swelling, no adverse reactions  Soft tissue mobs, massage to palm,and incision as well as hand and forearm  followed by cupping for scar mobilization .  Reviewed exercises 1-6 from [place] protocol( pages 17 and 18)- 10 reps each. Added passive thumb MP flexion, prayer stretch, composite finger MP flexion and finger abduction/ adduction, 10 reps each min v.c Therapist recommends pt keeps a journal of when her pain increases and what she has been doing. Therapist recommends a rest break every 30 mins. Paraffin x 7 mins to LUE for pain and stiffness, no adverse reactions. Pt's pain was improved at end of session to 0/10.  7/29/25Modalities:Ultrasound:  Time: 6 mins Location: left volar wrist and palm 3.3 mhz, 0.8 w/cm 2, 20%, for pain and swelling, no adverse reactions  Soft tissue mobs, massage to palm and incision followed by cupping for scar mobilization .  Reviewed exercises from [place] protocol 10 reps each. Therapist recommends a compression glove. [facility]. Pt was provided with tensogrip for gentle compression in the mantime. Therpist recommends pt wears her wrist brace during heavier repetative activities as pt reports assisting with a move at work last week and her hand was really painful on Sunday. Paraffin x 8 mins to LUE for pain and stiffness, no adverse reactions. Pt's pain was improved at end of session.                                                                                                 01/14/24- Paraffin x 8 mins to LUE for pain and stiffness. Reviewed previously issued HEP from [place] protocol 10 reps each, added prayer stretch. Soft tissue mobs, massage to palm and incision.                                                                                                               Modalities:Ultrasound:  Time: 8 mins Location: left volar wrist and palm 3.3 mhz, 0.8 w/cm 2, 20%, for pain and swelling, no adverse reactions    01/09/24 -eval, see pt education                                                                                                                        Ultrasound:  Time: 8 mins Location: left volar wrist and palm 3.3 mhz, 0.8 w/cm 2, 20%, for pain and swelling, no adverse reactions   PATIENT EDUCATION: Education details:yellow putty HEP review Person educated: Patient Education method: demonstration, v.c,  Education comprehension: verbalized understanding, returned demonstration, and verbal cues required  HOME EXERCISE PROGRAM: 01/09/24- CT release exercises from [place] protocol.  02/25/24- yellow putty  GOALS: Goals reviewed with patient? Yes  SHORT TERM GOALS: Target date: 02/09/24  I with inital HEP  Goal status: met,  01/14/24  2.  Pt will report LUE pain no greater than 4/10 for ADLs/ light use.  Goal status: ongoing, 3-8/10, 03/03/24  3.  Pt will demonstrate ability to oppose all digits with thumb. Baseline: able to oppose only digits 2,3 Goal status: met 01/23/24  4.  Pt will increase thumb MP flexion to 55* or greater for increased functional use. Baseline: 45 met, 60 02/25/24  5.  Pt will increase thumb IP flexion to 70* or greater for increased functional use Baseline: 60 Goal status: ongoing 65 *03/03/24  6.  Pt will demonstrate at least 95% composite finger flexion for LUE. Baseline: 80% Goal status:  met, 02/25/24  LONG TERM GOALS: Target date: 04/02/24  I with updated HEP.  Goal status: met for yellow putty 03/03/24   2.  Pt will report  LUE pain no greater than 3/10 for ADLS/IADLs. Baseline: 6/10 Goal status: ongoing 03/03/24  3.  Pt will improve Quick DASH score to 35% or better impairment Baseline: 42.5% Goal status: ongoing 03/03/24  4.  Pt will demonstrate LUE grip strength of at least 25lbs for increased functional use. Baseline: RUE 50 lbs, LUE10 lbs Goal status: ongoing 20 lbs 03/03/24   ASSESSMENT:  CLINICAL IMPRESSION: Pt is progressing towards goals with improving strength. Patient reports her pain is improved overall. She  continues to demonstrate pain and stiffness with composite thumb flexion and passive wrist extension.SABRA PERFORMANCE DEFICITS: in functional skills including ADLs, IADLs, coordination, dexterity, sensation, edema, ROM, strength, pain, flexibility, Fine motor control, Gross motor control, endurance, and UE functional use,  and psychosocial skills including coping strategies, environmental adaptation, habits, interpersonal interactions, and routines and behaviors.   IMPAIRMENTS: are limiting patient from ADLs, IADLs, rest and sleep, work, play, leisure, and social participation.   COMORBIDITIES: has no other co-morbidities that affects occupational performance. Patient will benefit from skilled OT to address above impairments and improve overall function.  MODIFICATION OR ASSISTANCE TO COMPLETE EVALUATION: No modification of tasks or assist necessary to complete an evaluation.  OT OCCUPATIONAL PROFILE AND HISTORY: Detailed assessment: Review of records and additional review of physical, cognitive, psychosocial history related to current functional performance.  CLINICAL DECISION MAKING: LOW - limited treatment options, no task modification necessary  REHAB POTENTIAL: Good  EVALUATION COMPLEXITY: Low      PLAN:  OT FREQUENCY: 15 visits  OT DURATION: 12 weeks  PLANNED INTERVENTIONS: 97168 OT Re-evaluation, 97535 self care/ADL training, 02889 therapeutic exercise, 97530 therapeutic activity, 97112 neuromuscular re-education, 97140 manual therapy, 97035 ultrasound, 97018 paraffin, 02989 moist heat, 97010 cryotherapy, 97014 electrical stimulation unattended, scar mobilization, passive range of motion, energy conservation, coping strategies training, patient/family education, and DME and/or AE instructions  RECOMMENDED OTHER SERVICES: n/a  CONSULTED AND AGREED WITH PLAN OF CARE: Patient  PLAN FOR NEXT SESSION:  consider upgradeing puty exercises, US , paraffin.   Tomiko Schoon, OT 03/03/2024,  9:09 AM

## 2024-03-10 ENCOUNTER — Ambulatory Visit: Admitting: Occupational Therapy
